# Patient Record
Sex: Female | Born: 1935 | Race: White | Hispanic: No | Smoking: Never smoker
Health system: Southern US, Community
[De-identification: ages and names within clinical notes are randomized; demographics above are authoritative.]

## PROBLEM LIST (undated history)

## (undated) DIAGNOSIS — Z7901 Long term (current) use of anticoagulants: Secondary | ICD-10-CM

## (undated) DIAGNOSIS — D649 Anemia, unspecified: Secondary | ICD-10-CM

## (undated) DIAGNOSIS — I739 Peripheral vascular disease, unspecified: Secondary | ICD-10-CM

## (undated) DIAGNOSIS — I495 Sick sinus syndrome: Secondary | ICD-10-CM

## (undated) DIAGNOSIS — I351 Nonrheumatic aortic (valve) insufficiency: Secondary | ICD-10-CM

## (undated) DIAGNOSIS — I779 Disorder of arteries and arterioles, unspecified: Secondary | ICD-10-CM

## (undated) DIAGNOSIS — R942 Abnormal results of pulmonary function studies: Secondary | ICD-10-CM

## (undated) DIAGNOSIS — R9430 Abnormal result of cardiovascular function study, unspecified: Secondary | ICD-10-CM

## (undated) DIAGNOSIS — J90 Pleural effusion, not elsewhere classified: Secondary | ICD-10-CM

## (undated) DIAGNOSIS — E039 Hypothyroidism, unspecified: Secondary | ICD-10-CM

## (undated) DIAGNOSIS — N184 Chronic kidney disease, stage 4 (severe): Secondary | ICD-10-CM

## (undated) DIAGNOSIS — I10 Essential (primary) hypertension: Secondary | ICD-10-CM

## (undated) DIAGNOSIS — I509 Heart failure, unspecified: Secondary | ICD-10-CM

## (undated) DIAGNOSIS — Z9229 Personal history of other drug therapy: Secondary | ICD-10-CM

## (undated) DIAGNOSIS — I2510 Atherosclerotic heart disease of native coronary artery without angina pectoris: Secondary | ICD-10-CM

## (undated) DIAGNOSIS — Z950 Presence of cardiac pacemaker: Secondary | ICD-10-CM

## (undated) DIAGNOSIS — I480 Paroxysmal atrial fibrillation: Secondary | ICD-10-CM

## (undated) HISTORY — DX: Sick sinus syndrome: I49.5

## (undated) HISTORY — DX: Personal history of other drug therapy: Z92.29

## (undated) HISTORY — DX: Atherosclerotic heart disease of native coronary artery without angina pectoris: I25.10

## (undated) HISTORY — DX: Chronic kidney disease, stage 4 (severe): N18.4

## (undated) HISTORY — DX: Peripheral vascular disease, unspecified: I73.9

## (undated) HISTORY — DX: Disorder of arteries and arterioles, unspecified: I77.9

## (undated) HISTORY — DX: Heart failure, unspecified: I50.9

## (undated) HISTORY — DX: Abnormal results of pulmonary function studies: R94.2

## (undated) HISTORY — DX: Anemia, unspecified: D64.9

## (undated) HISTORY — DX: Paroxysmal atrial fibrillation: I48.0

## (undated) HISTORY — DX: Hypothyroidism, unspecified: E03.9

## (undated) HISTORY — DX: Presence of cardiac pacemaker: Z95.0

## (undated) HISTORY — PX: ABDOMINAL HYSTERECTOMY: SHX81

## (undated) HISTORY — DX: Pleural effusion, not elsewhere classified: J90

## (undated) HISTORY — DX: Long term (current) use of anticoagulants: Z79.01

## (undated) HISTORY — DX: Nonrheumatic aortic (valve) insufficiency: I35.1

## (undated) HISTORY — DX: Essential (primary) hypertension: I10

## (undated) HISTORY — DX: Abnormal result of cardiovascular function study, unspecified: R94.30

---

## 2008-02-09 ENCOUNTER — Ambulatory Visit: Payer: Self-pay | Admitting: Cardiology

## 2008-02-11 ENCOUNTER — Encounter: Payer: Self-pay | Admitting: Cardiology

## 2008-02-17 ENCOUNTER — Encounter: Payer: Self-pay | Admitting: Cardiology

## 2010-10-15 ENCOUNTER — Inpatient Hospital Stay (HOSPITAL_COMMUNITY)
Admission: EM | Admit: 2010-10-15 | Discharge: 2010-11-05 | Payer: Self-pay | Attending: Cardiology | Admitting: Cardiology

## 2010-10-16 ENCOUNTER — Encounter: Payer: Self-pay | Admitting: Cardiology

## 2010-10-17 ENCOUNTER — Encounter: Payer: Self-pay | Admitting: Cardiology

## 2010-10-23 LAB — BASIC METABOLIC PANEL
BUN: 56 mg/dL — ABNORMAL HIGH (ref 6–23)
CO2: 29 mEq/L (ref 19–32)
Calcium: 8.4 mg/dL (ref 8.4–10.5)
Chloride: 100 mEq/L (ref 96–112)
Creatinine, Ser: 3.47 mg/dL — ABNORMAL HIGH (ref 0.4–1.2)
GFR calc Af Amer: 15 mL/min — ABNORMAL LOW (ref >60)
GFR calc non Af Amer: 13 mL/min — ABNORMAL LOW (ref >60)
Glucose, Bld: 107 mg/dL — ABNORMAL HIGH (ref 70–99)
Potassium: 4.5 mEq/L (ref 3.5–5.1)
Sodium: 137 mEq/L (ref 135–145)

## 2010-10-23 LAB — CBC
HCT: 26.5 % — ABNORMAL LOW (ref 36.0–46.0)
Hemoglobin: 8.4 g/dL — ABNORMAL LOW (ref 12.0–15.0)
MCH: 29.9 pg (ref 26.0–34.0)
MCHC: 31.7 g/dL (ref 30.0–36.0)
MCV: 94.3 fL (ref 78.0–100.0)
Platelets: 278 10*3/uL (ref 150–400)
RBC: 2.81 MIL/uL — ABNORMAL LOW (ref 3.87–5.11)
RDW: 15.3 % (ref 11.5–15.5)
WBC: 7.2 10*3/uL (ref 4.0–10.5)

## 2010-10-23 LAB — PROTIME-INR
INR: 4.61 — ABNORMAL HIGH (ref 0.00–1.49)
Prothrombin Time: 43.4 seconds — ABNORMAL HIGH (ref 11.6–15.2)

## 2010-10-23 LAB — HEPARIN LEVEL (UNFRACTIONATED): Heparin Unfractionated: 0.43 IU/mL (ref 0.30–0.70)

## 2010-10-24 LAB — BASIC METABOLIC PANEL
BUN: 45 mg/dL — ABNORMAL HIGH (ref 6–23)
CO2: 28 mEq/L (ref 19–32)
Calcium: 8.4 mg/dL (ref 8.4–10.5)
Chloride: 100 mEq/L (ref 96–112)
Creatinine, Ser: 3.03 mg/dL — ABNORMAL HIGH (ref 0.4–1.2)
GFR calc Af Amer: 18 mL/min — ABNORMAL LOW (ref >60)
GFR calc non Af Amer: 15 mL/min — ABNORMAL LOW (ref >60)
Glucose, Bld: 94 mg/dL (ref 70–99)
Potassium: 4.5 mEq/L (ref 3.5–5.1)
Sodium: 137 mEq/L (ref 135–145)

## 2010-10-24 LAB — CBC
HCT: 28.3 % — ABNORMAL LOW (ref 36.0–46.0)
Hemoglobin: 9 g/dL — ABNORMAL LOW (ref 12.0–15.0)
MCH: 29.7 pg (ref 26.0–34.0)
MCHC: 31.8 g/dL (ref 30.0–36.0)
MCV: 93.4 fL (ref 78.0–100.0)
Platelets: 330 10*3/uL (ref 150–400)
RBC: 3.03 MIL/uL — ABNORMAL LOW (ref 3.87–5.11)
RDW: 15.2 % (ref 11.5–15.5)
WBC: 8.5 10*3/uL (ref 4.0–10.5)

## 2010-10-24 LAB — PROTIME-INR
INR: 4.14 — ABNORMAL HIGH (ref 0.00–1.49)
Prothrombin Time: 40 seconds — ABNORMAL HIGH (ref 11.6–15.2)

## 2010-10-24 LAB — HEPARIN LEVEL (UNFRACTIONATED): Heparin Unfractionated: <0.1 IU/mL — ABNORMAL LOW (ref 0.30–0.70)

## 2010-10-24 LAB — HEMOCCULT GUIAC POC 1CARD (OFFICE): Fecal Occult Bld: NEGATIVE

## 2010-10-25 LAB — CBC
HCT: 29.4 % — ABNORMAL LOW (ref 36.0–46.0)
Hemoglobin: 9.3 g/dL — ABNORMAL LOW (ref 12.0–15.0)
MCH: 29.6 pg (ref 26.0–34.0)
MCHC: 31.6 g/dL (ref 30.0–36.0)
MCV: 93.6 fL (ref 78.0–100.0)
Platelets: 371 10*3/uL (ref 150–400)
RBC: 3.14 MIL/uL — ABNORMAL LOW (ref 3.87–5.11)
RDW: 15.4 % (ref 11.5–15.5)
WBC: 8.6 10*3/uL (ref 4.0–10.5)

## 2010-10-25 LAB — PROTIME-INR
INR: 2.85 — ABNORMAL HIGH (ref 0.00–1.49)
Prothrombin Time: 30 seconds — ABNORMAL HIGH (ref 11.6–15.2)

## 2010-10-25 LAB — BASIC METABOLIC PANEL
BUN: 40 mg/dL — ABNORMAL HIGH (ref 6–23)
CO2: 26 mEq/L (ref 19–32)
Calcium: 8.3 mg/dL — ABNORMAL LOW (ref 8.4–10.5)
Chloride: 99 mEq/L (ref 96–112)
Creatinine, Ser: 2.75 mg/dL — ABNORMAL HIGH (ref 0.4–1.2)
GFR calc Af Amer: 20 mL/min — ABNORMAL LOW (ref >60)
GFR calc non Af Amer: 16 mL/min — ABNORMAL LOW (ref >60)
Glucose, Bld: 93 mg/dL (ref 70–99)
Potassium: 4.5 mEq/L (ref 3.5–5.1)
Sodium: 134 mEq/L — ABNORMAL LOW (ref 135–145)

## 2010-10-31 ENCOUNTER — Encounter: Payer: Self-pay | Admitting: Cardiology

## 2010-11-01 HISTORY — PX: PACEMAKER INSERTION: SHX728

## 2010-11-03 ENCOUNTER — Encounter: Payer: Self-pay | Admitting: Cardiology

## 2010-11-04 ENCOUNTER — Encounter: Payer: Self-pay | Admitting: Internal Medicine

## 2010-11-04 LAB — CBC
HCT: 29.5 % — ABNORMAL LOW (ref 36.0–46.0)
HCT: 27.6 % — ABNORMAL LOW (ref 36.0–46.0)
HCT: 27.3 % — ABNORMAL LOW (ref 36.0–46.0)
HCT: 30.6 % — ABNORMAL LOW (ref 36.0–46.0)
Hemoglobin: 9.4 g/dL — ABNORMAL LOW (ref 12.0–15.0)
Hemoglobin: 8.8 g/dL — ABNORMAL LOW (ref 12.0–15.0)
Hemoglobin: 8.7 g/dL — ABNORMAL LOW (ref 12.0–15.0)
Hemoglobin: 9.7 g/dL — ABNORMAL LOW (ref 12.0–15.0)
MCH: 29.4 pg (ref 26.0–34.0)
MCH: 29.7 pg (ref 26.0–34.0)
MCH: 29.7 pg (ref 26.0–34.0)
MCH: 29.3 pg (ref 26.0–34.0)
MCHC: 31.9 g/dL (ref 30.0–36.0)
MCHC: 31.9 g/dL (ref 30.0–36.0)
MCHC: 31.9 g/dL (ref 30.0–36.0)
MCHC: 31.7 g/dL (ref 30.0–36.0)
MCV: 92.2 fL (ref 78.0–100.0)
MCV: 93.2 fL (ref 78.0–100.0)
MCV: 93.2 fL (ref 78.0–100.0)
MCV: 92.4 fL (ref 78.0–100.0)
Platelets: 386 10*3/uL (ref 150–400)
Platelets: 374 10*3/uL (ref 150–400)
Platelets: 374 10*3/uL (ref 150–400)
Platelets: 467 10*3/uL — ABNORMAL HIGH (ref 150–400)
RBC: 3.2 MIL/uL — ABNORMAL LOW (ref 3.87–5.11)
RBC: 2.96 MIL/uL — ABNORMAL LOW (ref 3.87–5.11)
RBC: 2.93 MIL/uL — ABNORMAL LOW (ref 3.87–5.11)
RBC: 3.31 MIL/uL — ABNORMAL LOW (ref 3.87–5.11)
RDW: 15.4 % (ref 11.5–15.5)
RDW: 15.6 % — ABNORMAL HIGH (ref 11.5–15.5)
RDW: 15.8 % — ABNORMAL HIGH (ref 11.5–15.5)
RDW: 15.5 % (ref 11.5–15.5)
WBC: 9 10*3/uL (ref 4.0–10.5)
WBC: 8.8 10*3/uL (ref 4.0–10.5)
WBC: 10.6 10*3/uL — ABNORMAL HIGH (ref 4.0–10.5)
WBC: 11.5 10*3/uL — ABNORMAL HIGH (ref 4.0–10.5)

## 2010-11-04 LAB — BASIC METABOLIC PANEL
BUN: 40 mg/dL — ABNORMAL HIGH (ref 6–23)
BUN: 43 mg/dL — ABNORMAL HIGH (ref 6–23)
BUN: 48 mg/dL — ABNORMAL HIGH (ref 6–23)
BUN: 47 mg/dL — ABNORMAL HIGH (ref 6–23)
BUN: 46 mg/dL — ABNORMAL HIGH (ref 6–23)
CO2: 26 mEq/L (ref 19–32)
CO2: 27 mEq/L (ref 19–32)
CO2: 28 mEq/L (ref 19–32)
CO2: 30 mEq/L (ref 19–32)
CO2: 31 mEq/L (ref 19–32)
Calcium: 8.2 mg/dL — ABNORMAL LOW (ref 8.4–10.5)
Calcium: 8 mg/dL — ABNORMAL LOW (ref 8.4–10.5)
Calcium: 8.1 mg/dL — ABNORMAL LOW (ref 8.4–10.5)
Calcium: 8.3 mg/dL — ABNORMAL LOW (ref 8.4–10.5)
Calcium: 8.5 mg/dL (ref 8.4–10.5)
Chloride: 100 mEq/L (ref 96–112)
Chloride: 103 mEq/L (ref 96–112)
Chloride: 100 mEq/L (ref 96–112)
Chloride: 96 mEq/L (ref 96–112)
Chloride: 98 mEq/L (ref 96–112)
Creatinine, Ser: 2.61 mg/dL — ABNORMAL HIGH (ref 0.4–1.2)
Creatinine, Ser: 2.59 mg/dL — ABNORMAL HIGH (ref 0.4–1.2)
Creatinine, Ser: 2.66 mg/dL — ABNORMAL HIGH (ref 0.4–1.2)
Creatinine, Ser: 2.47 mg/dL — ABNORMAL HIGH (ref 0.4–1.2)
Creatinine, Ser: 2.21 mg/dL — ABNORMAL HIGH (ref 0.4–1.2)
GFR calc Af Amer: 21 mL/min — ABNORMAL LOW (ref >60)
GFR calc Af Amer: 21 mL/min — ABNORMAL LOW (ref >60)
GFR calc Af Amer: 21 mL/min — ABNORMAL LOW (ref >60)
GFR calc Af Amer: 23 mL/min — ABNORMAL LOW (ref >60)
GFR calc Af Amer: 26 mL/min — ABNORMAL LOW (ref >60)
GFR calc non Af Amer: 18 mL/min — ABNORMAL LOW (ref >60)
GFR calc non Af Amer: 18 mL/min — ABNORMAL LOW (ref >60)
GFR calc non Af Amer: 17 mL/min — ABNORMAL LOW (ref >60)
GFR calc non Af Amer: 19 mL/min — ABNORMAL LOW (ref >60)
GFR calc non Af Amer: 21 mL/min — ABNORMAL LOW (ref >60)
Glucose, Bld: 104 mg/dL — ABNORMAL HIGH (ref 70–99)
Glucose, Bld: 109 mg/dL — ABNORMAL HIGH (ref 70–99)
Glucose, Bld: 113 mg/dL — ABNORMAL HIGH (ref 70–99)
Glucose, Bld: 106 mg/dL — ABNORMAL HIGH (ref 70–99)
Glucose, Bld: 103 mg/dL — ABNORMAL HIGH (ref 70–99)
Potassium: 5 mEq/L (ref 3.5–5.1)
Potassium: 5 mEq/L (ref 3.5–5.1)
Potassium: 4.7 mEq/L (ref 3.5–5.1)
Potassium: 4.4 mEq/L (ref 3.5–5.1)
Potassium: 4.3 mEq/L (ref 3.5–5.1)
Sodium: 135 mEq/L (ref 135–145)
Sodium: 136 mEq/L (ref 135–145)
Sodium: 136 mEq/L (ref 135–145)
Sodium: 136 mEq/L (ref 135–145)
Sodium: 137 mEq/L (ref 135–145)

## 2010-11-04 LAB — PROTIME-INR
INR: 2.37 — ABNORMAL HIGH (ref 0.00–1.49)
INR: 2.47 — ABNORMAL HIGH (ref 0.00–1.49)
INR: 2.37 — ABNORMAL HIGH (ref 0.00–1.49)
INR: 2.05 — ABNORMAL HIGH (ref 0.00–1.49)
INR: 1.79 — ABNORMAL HIGH (ref 0.00–1.49)
INR: 1.72 — ABNORMAL HIGH (ref 0.00–1.49)
INR: 1.6 — ABNORMAL HIGH (ref 0.00–1.49)
INR: 1.84 — ABNORMAL HIGH (ref 0.00–1.49)
INR: 2.5 — ABNORMAL HIGH (ref 0.00–1.49)
Prothrombin Time: 26 seconds — ABNORMAL HIGH (ref 11.6–15.2)
Prothrombin Time: 26.9 seconds — ABNORMAL HIGH (ref 11.6–15.2)
Prothrombin Time: 26 seconds — ABNORMAL HIGH (ref 11.6–15.2)
Prothrombin Time: 23.3 seconds — ABNORMAL HIGH (ref 11.6–15.2)
Prothrombin Time: 21 seconds — ABNORMAL HIGH (ref 11.6–15.2)
Prothrombin Time: 20.3 seconds — ABNORMAL HIGH (ref 11.6–15.2)
Prothrombin Time: 19.2 seconds — ABNORMAL HIGH (ref 11.6–15.2)
Prothrombin Time: 21.4 seconds — ABNORMAL HIGH (ref 11.6–15.2)
Prothrombin Time: 27.1 seconds — ABNORMAL HIGH (ref 11.6–15.2)

## 2010-11-04 LAB — BRAIN NATRIURETIC PEPTIDE: Pro B Natriuretic peptide (BNP): 1591 pg/mL — ABNORMAL HIGH (ref 0.0–100.0)

## 2010-11-06 LAB — PROTIME-INR
INR: 2.72 — ABNORMAL HIGH (ref 0.00–1.49)
INR: 2.2 — ABNORMAL HIGH (ref 0.00–1.49)
Prothrombin Time: 28.9 seconds — ABNORMAL HIGH (ref 11.6–15.2)
Prothrombin Time: 24.6 seconds — ABNORMAL HIGH (ref 11.6–15.2)

## 2010-11-08 NOTE — Discharge Summary (Signed)
NAMEBARBARITA, [name] NO.:  [id number]  MEDICAL RECORD NO.:  [id number]          PATIENT TYPE:  INP  LOCATION:  3701                         FACILITY:  [facility]  PHYSICIAN:  Jesse Sans. Wall, MD, FACCDATE OF BIRTH:  1926-04-30  DATE OF ADMISSION:  10/15/2010 DATE OF DISCHARGE:  11/04/2010                              DISCHARGE SUMMARY   PRIMARY CARDIOLOGIST:  [facility] (will be set up with followup in the next 1-3 weeks, has not been seen in office previously)  ELECTROPHYSIOLOGIST:  Doylene Canning. Ladona Ridgel, MD  DISCHARGE DIAGNOSES: 1. Non-ST segment elevation myocardial infarction (presumed old).     a.     Peak troponin 0.10 on second set, for reported symptoms      likely 3 to 4 weeks prior to presentation.  Risks, benefits are      non-favor of diagnostic cardiac catheterization this admission. 2. Acute systolic congestive heart failure secondary to ischemic     cardiomyopathy, well compensated with LVEF 30% to 35%.     a.     2-D echocardiogram on October 16, 2010:  LV cavity size      normal, mild concentric hypertrophy, LVEF of 30% to 35% with      akinesis of the apical myocardium and aneurysmal deformity of the      apical myocardium.  Mild aortic insufficiency.  Calcified mitral      valve annulus with mild MR.  PA peak pressure at 39 mmHg. 3. Paroxysmal atrial fibrillation.     a.     Anticoagulated on Coumadin secondary to a CHADS-VAS score      equal to 6 and antiarrhythmic therapy on amiodarone 200 mg p.o.      daily. 4. Acute on chronic renal failure (CKD, stage IV). 5. Left pleural effusion.     a.     Status post thoracocentesis with 950 mL blood-tinged fluid      (pathology report shows no malignant cells, but reactive      mesothelial cells present).     b.     Chest tube placed on October 31, 2010.  Followup arranged at      [facility] as well as with Dr. Edwyna Shell on November 12, 2010 at 1:00 p.m.      including chest x-ray that day. 6. Tachy-brady  syndrome.     a.     Implantation of Medtronic dual-chamber pacemaker on November 01, 2010 without apparent complications. 7. Deconditioning.     a.     PT eval deemed appropriate for discharge to SNF.  Worked      with Cardiac Rehab phase I for the duration of hospital stay. 8. Anemia.     a.     Hemoglobin nadir of 8.4 and fecal occult blood negative.      Coumadin briefly held, but after H and H stabilized Coumadin      resumed secondary to increased thromboembolic risk. 9. Poor p.o. intake.     a.     Nutrition consult with recommendation for ensure      supplementation b.i.d. as well  as education/encouragement in      regards to proper nutrition/p.o. intake.  SECONDARY DIAGNOSES: 1. Hypertension. 2. Status post hysterectomy.  ALLERGIES AND INTOLERANCES:  PENICILLIN (unknown reaction.).  PROCEDURES: 1. EKG on October 15, 2010:  Junctional bradycardia, 46 bpm, low-     voltage QRS, question significant Q-waves in anterolateral leads, T-     wave inversion in V3 through V4 as well as one and aVL, nonspecific     ST - T-wave changes, otherwise QRS 68, QTC 367. 2. EKG on October 15, 2010:  Atrial fibrillation with RVR, 103 bpm,     otherwise no significant changes, QTC of 531. 3. CXR on October 16, 2010:  Moderately large left pleural effusion     with associated basilar airspace disease, question atelectasis     versus pneumonia.  Very small right pleural effusion.  Cardiomegaly     without edema. 4. EKG on October 16, 2010:  No significant change from prior     tracing. 5. Chest x-ray/left decubitus on October 16, 2010:  Layering and     moderate-to-large left pleural effusion. 6. 2-D echocardiogram that was done October 16, 2010.  Please see     discharge diagnoses section under acute systolic heart failure,     subsection A. 7. EKG on October 17, 2010:  NSR, 96 bpm, PVC, otherwise no     significant changes from prior tracing. 8. Ultrasound-guided left  thoracocentesis on October 17, 2010:     Successful ultrasound-guided left thoracocentesis yielding 950 mL     of blood-tinged pleural fluid. 9. EKG on October 18, 2010:  NSR, no significant change from prior     tracings. 10.Chest x-ray on October 21, 2010:  Slight interval increase in     moderate left pleural effusion without edema or other new findings. 11.EKG on October 22, 2010:  Marked sinus bradycardia, 48 bpm,     otherwise no significant changes prior tracing. 12.Chest x-ray on October 28, 2010:  Significant interval increase in     left pleural effusion. 13.Chest x-ray on October 31, 2010:  No significant change.  Stable     large left effusion and small right effusion. 14.Chest x-ray on October 31, 2010:  Decreased left pleural effusion     with a catheter in place.  No pneumothorax. 15.Insertion of the left PleurX catheter on October 31, 2010:     Successful without significant. 16.Chest x-ray on November 01, 2010:  Some decrease in small left     pleural effusion with a PleurX catheter in place.  No pneumothorax     or new abnormality. 17.Implantation of Medtronic dual-chamber pacemaker on  November 01, 2010:  Successful without apparent complications. 18.Chest x-ray on November 02, 2010:  Pacemaker placement without     evidence of pneumothorax. 19.EKG on November 02, 2010:  NSR, 87 bpm, no significant change from     other tracings, otherwise. 20.EKG on November 03, 2010:  NSR, 85 bpm, otherwise no significant     changes from prior tracing.  HISTORY OF PRESENT ILLNESS:  [name] is an 77 year old Caucasian female with the above-noted medical history, which did not include any known history of coronary artery disease who presented initially the [facility] with complaints of 3 weeks of worsening shortness of breath/dyspnea on exertion and no frank chest pain but "chest fullness" as well as unintentional weight loss of about 10 pounds.  She was subsequently found  to have a large  left pleural effusion and a troponin of 0.11 as well as slight soft blood pressures in the high 90s and was transferred to [facility] for further eval/therapy.  HOSPITAL COURSE:  The patient was admitted to [facility] and it was determined that the patient likely had NSTEMI that had occurred approximately 3 weeks prior and due to her multiple comorbidities, risk and benefit analysis did not favor cardiac catheterization at that time. She did undergo thoracocentesis from Interventional Radiology as described in the discharge diagnosis and procedure section.  No significant complications, but unfortunately there was return of significant pleural effusion on left side and she eventually required a chest tube placed as described in procedure section.  She is being discharged with this tube in and will be drained on Monday, Wednesday, Friday at her skilled nursing facility and she has followup with Dr. Edwyna Shell is scheduled on the 24.  The patient was initially noted to be bradycardic on initial EKG tracings and was evaluated by Electrophysiology, but there was hope that she could avoid pacemaker placement in the setting of multiple comorbidities.  Unfortunately, she was noted to have a 5.15-second pause on the very early morning of November 01, 2010 and as her meds have been adjusted several times, it was felt that she would require pacemaker implantation, which was completed that day without significant complications.  Her hospital course was further complicated by the finding of anemia, but with no evidence of frank blood and once her H and H stabled, her Coumadin which had briefly been held was resumed.  No further workup was planned as her H and H was rising.  Her initial presentation was consistent with acute systolic heart failure and initially she was able to be diuresed even in the setting of acute on chronic renal failure, CKD stage IV, but eventually secondary to  recurrence of volume overload in the setting of even higher creatinine levels, the patient was briefly (2 days) treated with positive inotrope/dobutamine.  The patient responded well and no further positive inotropes were required this admission.  It was also noted on her admission that she had poor p.o. intake and actually lost 10 pounds even in the setting of her acute systolic heart failure and nutrition consult was called recommending Ensure b.i.d. as well as encouraging education on the importance of heart-healthy diet and regular meals/proper p.o. intake.  Secondary to all of the above, the patient was not especially active and was deconditioned when she arrived and this continued be a problem during her hospital stay.  She did work daily with Cardiac Rehab phase 1 as well as being evaluated by Physical Therapy who determined that she was appropriate for discharge to a skilled nursing facility.  A bed appears to be available today, November 04, 2010, although we are awaiting confirmation.  She was deemed stable for discharge by attending cardiologist, Dr. Valera Castle in the morning of January 16.  She has the above followup with Dr. Edwyna Shell and will have INR/PT checked in her skilled nursing facility.  She has an appointment to be seen at the [facility] on January 23 at 9:30 a.m.  She will also be set up to see one of the cardiologist or a physician assistant at [facility] in the next 2-3 weeks for management of her presumed coronary artery disease as well as systolic heart failure (2-D echocardiogram did show depressed LVEF of 30% - 35% on the December 28).  At the time of  discharge, the patient received her new medication list, prescriptions, followup instructions, post pacemaker implantation instructions, and all questions and concerns were addressed prior to leaving the hospital.  DISCHARGE LABS:  WBC is 11.5, HGB 9.7, HCT 30.6, PLT count is 467.   Pro time 28.9, INR 2.72.  Sodium 137, potassium 4.3, chloride 98, bicarb 31, BUN 46, creatinine 2.21, glucose 103, calcium 8.5.  Liver function tests on December 28, all within normal limits.  Total protein was 5.9 and albumin 2.6.  Hemoglobin A1c 6.2%.  First set of cardiac enzymes CK 39, MB 2.0, troponin 0.11.  Second set, CK 45, MB 3.0, troponin-I 0.10. Third set CK 53, MB 3.3, troponin-I 0.09.  Total cholesterol 121, triglycerides 57, HDL 43, LDL 67, total cholesterol/HDL ratio of 2.8. TSH 0.780.  Pleural fluid, total protein 3.3, LDH 152.  Color was orange, appearance hazy, wbc count 69, segmented neutrophils 25, lymphocytes 52, monocyte - macrophage 19, eosinophils 4, mesothelial cells present.  Fecal occult blood negative.  Note, pleural fluid no organisms seen and no growth x3 days.  FOLLOWUP PLANS AND APPOINTMENTS: 1. [facility], November 11, 2010 at 9:30 a.m. 2. Dr. Edwyna Shell, November 12, 2010 at 1:00 p.m. 3. [facility] in approximately 2 weeks (the patient will be     contacted with date and time).  DURATION OF DISCHARGE ENCOUNTER INCLUDING PHYSICIAN TIME:  1 hour.     Jarrett Ables, PAC   ______________________________ Jesse Sans. Daleen Squibb, MD, [facility]    MS/MEDQ  D:  11/04/2010  T:  11/04/2010  Job:  [id number]  cc:   Ines Bloomer, M.D. Doylene Canning. Ladona Ridgel, MD Marca Ancona, MD Tereso Newcomer, PA-C  Electronically Signed by Jarrett Ables PAC on 11/08/2010 01:34:10 PM Electronically Signed by Valera Castle MD [facility] on 11/08/2010 04:16:14 PM

## 2010-11-11 ENCOUNTER — Ambulatory Visit: Admit: 2010-11-11 | Payer: Self-pay

## 2010-11-12 ENCOUNTER — Encounter: Payer: Self-pay | Admitting: Cardiology

## 2010-11-12 ENCOUNTER — Ambulatory Visit
Admission: RE | Admit: 2010-11-12 | Discharge: 2010-11-12 | Payer: Self-pay | Source: Home / Self Care | Attending: Thoracic Surgery | Admitting: Thoracic Surgery

## 2010-11-12 ENCOUNTER — Encounter
Admission: RE | Admit: 2010-11-12 | Discharge: 2010-11-12 | Payer: Self-pay | Source: Home / Self Care | Attending: Thoracic Surgery | Admitting: Thoracic Surgery

## 2010-11-13 NOTE — Assessment & Plan Note (Unsigned)
OFFICE VISIT  [name], [name] A DOB:  10-02-1926                                        November 12, 2010 CHART #:  [id number]  HISTORY:  The patient is an 77 year old white female with a history of congestive heart failure who on October 31, 2010, had placement of a left-sided Pleurx catheter due to large effusion.  At the time of placement 1200 mL of fluid were obtained.  She is now back at the nursing facility and they have drained it on three occasions with low relative volumes of 250 and 300 mL.  On today's date, she is seen to check the catheter as well as a chest x-ray chest.  Chest x-ray was obtained and it reveals no significant findings of effusion.  The catheter appears to be in good position.  PHYSICAL EXAMINATION:  afebrile.  Vital signs stable.  Alert, elderly female in no acute distress.  Pulmonary exam reveals clear lungs throughout.  Cardiac examination, regular rate and rhythm.  No gallop. The catheter site is inspected and shows no evidence of infection although the dressing is not specifically as we require.  ASSESSMENT:  [name] is stable with the Pleurx catheter functioning well.  Her drainage amounts are small.  We will convert it to twice weekly.  We will send the nursing facility instructions on how to do the dressing changes for the catheter.  We will see her again in 2 weeks with a chest x-ray and pending the amounts of drainage it is possible we will schedule for catheter removal.  Rowe Clack, P.A.-C.  Sherryll Burger  D:  11/12/2010  T:  11/13/2010  Job:  [id number]

## 2010-11-14 ENCOUNTER — Telehealth: Payer: Self-pay | Admitting: Cardiology

## 2010-11-18 ENCOUNTER — Ambulatory Visit
Admission: RE | Admit: 2010-11-18 | Discharge: 2010-11-18 | Payer: Self-pay | Source: Home / Self Care | Attending: Cardiology | Admitting: Cardiology

## 2010-11-18 ENCOUNTER — Other Ambulatory Visit: Payer: Self-pay | Admitting: Cardiology

## 2010-11-18 ENCOUNTER — Encounter: Payer: Self-pay | Admitting: Internal Medicine

## 2010-11-18 ENCOUNTER — Encounter: Payer: Self-pay | Admitting: Cardiology

## 2010-11-18 ENCOUNTER — Telehealth: Payer: Self-pay | Admitting: Cardiology

## 2010-11-18 LAB — HEPATIC FUNCTION PANEL
ALT: 51 U/L — ABNORMAL HIGH (ref 0–35)
AST: 40 U/L — ABNORMAL HIGH (ref 0–37)
Albumin: 2.5 g/dL — ABNORMAL LOW (ref 3.5–5.2)
Alkaline Phosphatase: 121 U/L — ABNORMAL HIGH (ref 39–117)
Bilirubin, Direct: 0.1 mg/dL (ref 0.0–0.3)
Total Bilirubin: 0.6 mg/dL (ref 0.3–1.2)
Total Protein: 6 g/dL (ref 6.0–8.3)

## 2010-11-18 LAB — TSH: TSH: 3.91 u[IU]/mL (ref 0.35–5.50)

## 2010-11-20 ENCOUNTER — Encounter: Payer: Self-pay | Admitting: Internal Medicine

## 2010-11-21 NOTE — Miscellaneous (Signed)
Summary: Device preload  Clinical Lists Changes  Observations: Added new observation of PPM INDICATN: Sick sinus syndrome (11/04/2010 15:13) Added new observation of MAGNET RTE: BOL 85 ERI 65 (11/04/2010 15:13) Added new observation of PPMLEADSTAT2: active (11/04/2010 15:13) Added new observation of PPMLEADSER2: [id number] (11/04/2010 15:13) Added new observation of PPMLEADMOD2: 5076  (11/04/2010 15:13) Added new observation of PPMLEADLOC2: RV  (11/04/2010 15:13) Added new observation of PPMLEADSTAT1: active  (11/04/2010 15:13) Added new observation of PPMLEADSER1: [id number]  (11/04/2010 15:13) Added new observation of PPMLEADMOD1: 5076  (11/04/2010 15:13) Added new observation of PPMLEADLOC1: RA  (11/04/2010 15:13) Added new observation of PPM IMP MD: Lewayne Bunting, MD  (11/04/2010 15:13) Added new observation of PPMLEADDOI2: 11/01/2010  (11/04/2010 15:13) Added new observation of PPMLEADDOI1: 11/01/2010  (11/04/2010 15:13) Added new observation of PPM DOI: 11/01/2010  (11/04/2010 15:13) Added new observation of PPM SERL#: [id number]  (11/04/2010 15:13) Added new observation of PPM MODL#: [id number]  (11/04/2010 32:44) Added new observation of PACEMAKERMFG: Medtronic  (11/04/2010 15:13) Added new observation of PACEMAKER MD: Lewayne Bunting, MD  (11/04/2010 15:13)      PPM Specifications Following MD:  Lewayne Bunting, MD     PPM Vendor:  Medtronic     PPM Model Number:  [id number]     PPM Serial Number:  [id number] PPM DOI:  11/01/2010     PPM Implanting MD:  Lewayne Bunting, MD  Lead 1    Location: RA     DOI: 11/01/2010     Model #: [id number]     Serial #: [id number]     Status: active Lead 2    Location: RV     DOI: 11/01/2010     Model #: [id number]     Serial #: [id number]     Status: active  Magnet Response Rate:  BOL 85 ERI 65  Indications:  Sick sinus syndrome

## 2010-11-21 NOTE — Progress Notes (Signed)
Summary: pt daughter re mother condition  Phone Note Call from Patient Call back at [phone]   Caller: Daughter/pam Reason for Call: Talk to Nurse Summary of Call: pt daughter calling re pt having sob when she tries to get up and walk. pt daughter wants to know why she was not label as fall risk in her chart when she was discharge from the hospital. Initial call taken by: Roe Coombs,  November 14, 2010 11:40 AM     Appended Document: pt daughter re mother condition Unsure why not labeled fall risk, I did not discharge her.  Needs followup soon.   Appended Document: pt daughter re mother condition I talked with daughter, Caren Hazy  [phone] is in [place])--she has concerns about her mother's SOB --she states when she talks with her mother on the telephone she seems very SOB--she is also concerned because her mother has been taken off fall precautions by physical therapy at the [facility] in [place]--[phone]--Pam states she has talked with the nurse at the [facility] and expressed her concerns to the nurse--daughter  states pt  has a medical doctor there,but the doctor has not seen the patient since she has been there--I recommended that the daughter call the [facility] and again express her concerns --daughter agreed with this plan

## 2010-11-22 ENCOUNTER — Telehealth: Payer: Self-pay | Admitting: Cardiology

## 2010-11-25 ENCOUNTER — Encounter: Payer: Self-pay | Admitting: Cardiology

## 2010-11-25 ENCOUNTER — Encounter: Payer: Self-pay | Admitting: Internal Medicine

## 2010-11-25 ENCOUNTER — Encounter: Payer: Self-pay | Admitting: Physician Assistant

## 2010-11-25 ENCOUNTER — Other Ambulatory Visit: Payer: Self-pay | Admitting: Thoracic Surgery

## 2010-11-25 DIAGNOSIS — R0602 Shortness of breath: Secondary | ICD-10-CM

## 2010-11-25 DIAGNOSIS — J90 Pleural effusion, not elsewhere classified: Secondary | ICD-10-CM

## 2010-11-26 ENCOUNTER — Ambulatory Visit: Payer: Medicare Other | Admitting: Thoracic Surgery

## 2010-11-26 DIAGNOSIS — I5023 Acute on chronic systolic (congestive) heart failure: Secondary | ICD-10-CM

## 2010-11-27 NOTE — Progress Notes (Signed)
Summary: need office notes mailed to daughter [facility]  Phone Note Call from Patient Call back at Home Phone [phone]   Caller: Daughter/ Pam  Summary of Call: Pt daughter want notes from todays office visit mail to her Caren Hazy [address] [place] [address] and want notes faxed to the [facility] that the pt is to have a walker to help her get around. Initial call taken by: Judie Grieve,  November 18, 2010 4:31 PM  Follow-up for Phone Call        See previous note regarding the walker. Ms. Sellin daughter was not at the listed number so I left a message with [facility] that we would mail the information to her once Dr . Shirlee Latch has signed his note. Whitney Maeola Sarah RN  November 18, 2010 4:46 PM  Follow-up by: Whitney Maeola Sarah RN,  November 18, 2010 4:46 PM

## 2010-11-27 NOTE — Assessment & Plan Note (Signed)
Summary: 2 weeks eph/mt   Visit Type:  post hospital  CC:  shortness of breath - edema in ankles-- no appetite.  History of Present Illness: 77 yo with complicated recent admission to [facility] in 12/11.  She was admitted with progressive exertional dyspnea x 3 weeks as well as a large left pleural effusion.  Troponin was 0.11.  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  This was thought to be due to a prior MI (suspect at least 3 wks prior to admission).  LHC was not done due to renal failure and the distant MI. Hospitalization was complicated by CHF as well as by atrial fibrillation with RVR.  Treatment of the atrial fibrillation led to symptomatic bradycardia and she ultimately received a dual chamber Medtronic pacemaker.  She had a left-sided thoracentesis and eventually had a Pleurx catheter placed, which is still in.  Since discharge, she has been living at the [facility].   She is short of breath walking in the halls though she does ok in her room.  No orthopnea or PND.  No chest pain.    ECG: NSR, old anterior and lateral MI  Labs (1/12): K 4.3, creatinine 2.21 => 1.83, HCT 30.6  Preventive Screening-Counseling & Management  Alcohol-Tobacco     Smoking Status: never      Drug Use:  no.    Current Medications (verified): 1)  Amiodarone Hcl 200 Mg Tabs (Amiodarone Hcl) .... Take One Tablet By Mouth Daily 2)  Aspirin 81 Mg Tbec (Aspirin) 3)  Lipitor 40 Mg Tabs (Atorvastatin Calcium) .... Take One Tablet By Mouth Daily. 4)  Carvedilol 12.5 Mg Tabs (Carvedilol) .... Take 1 Tablet Twice Daily 5)  Hydralazine Hcl 50 Mg Tabs (Hydralazine Hcl) .... Take 1 Tab 3times Per Day 6)  Isosorbide Dinitrate 20 Mg Tabs (Isosorbide Dinitrate) .... Take 1 Tablet Three Times/day 7)  Calcium Acetate 667 Mg Tabs (Calcium Acetate (Phos Binder)) .... Three Times A Day 8)  Warfarin Sodium 1 Mg Tabs (Warfarin Sodium) .... Use As Directed By Anticoagualtion Clinic 9)  Nitrostat 0.4 Mg  Subl (Nitroglycerin) .Marland Kitchen.. 1 Tablet Under Tongue At Onset of Chest Pain; You May Repeat Every 5 Minutes For Up To 3 Doses. 10)  Furosemide 40 Mg Tabs (Furosemide)  Allergies (verified): 1)  ! Pcn  Past History:  Past Medical History: 1. Paroxysmal atrial fibrillation: Maintaining NSR with amiodarone.  On Warfarin.  2. Systolic CHF: Presumed ischemic cardiomyopathy.  Echo (12/11) with EF 30-35%, mild LV hypertrophy, apical aneurysm, mild AI, PA systolic pressure 39 mmHg.  3. Hypertension 4. CAD: Suspect prior anterior MI.  No left heart cath done at 12/11 admission as MI was probably old and creatinine was elevated.  5. Tachy-brady syndrome: Medtronic dual chamber PCM 1/12 6. CKD 7. Anemia: FOBT negative 8. History of hysterectomy 9. Left pleural effusion  Past Surgical History: Pacemaker Abdominal Hysterectomy-Total  Family History: Reviewed history and no changes required. Family History of Cancer Family History of Coronary Artery Disease:   Social History: Tobacco Use - No.  Alcohol Use - no Drug Use - no Retired  From [employer].  Currently living at the [facility].  Smoking Status:  never Drug Use:  no  Review of Systems       All systems reviewed and negative except as per HPI.   Vital Signs:  Patient profile:   77 year old female Height:      63 inches Weight:  123 pounds BMI:     21.87 Pulse rate:   77 / minute BP sitting:   128 / 56  (right arm) Cuff size:   regular  Vitals Entered By: Hardin Negus, RMA (November 18, 2010 11:26 AM)  Physical Exam  General:  Frail elderly woman in no distress Head:  normocephalic and atraumatic Nose:  no deformity, discharge, inflammation, or lesions Mouth:  Teeth, gums and palate normal. Oral mucosa normal. Neck:  Neck supple, JVP 8-9 cm. No masses, thyromegaly or abnormal cervical nodes. Lungs:  Decreased breath sounds and crackles at the left base.  Heart:  Non-displaced PMI, chest non-tender; regular rate and  rhythm, S1, S2 without rubs or gallops. 2/6 SEM RUSB, 2/6 HSM at apex.  Carotid upstroke normal, no bruit.  Pedals normal pulses. 1+ ankle edema with lower leg varicosities.  Abdomen:  Bowel sounds positive; abdomen soft and non-tender without masses, organomegaly, or hernias noted. No hepatosplenomegaly. Extremities:  No clubbing or cyanosis. Neurologic:  Alert and oriented x 3. Skin:  Intact without lesions or rashes. Psych:  Normal affect.   PPM Specifications Following MD:  Lewayne Bunting, MD     PPM Vendor:  Medtronic     PPM Model Number:  [id number]     PPM Serial Number:  [id number] PPM DOI:  11/01/2010     PPM Implanting MD:  Lewayne Bunting, MD  Lead 1    Location: RA     DOI: 11/01/2010     Model #: [id number]     Serial #: [id number]     Status: active Lead 2    Location: RV     DOI: 11/01/2010     Model #: [id number]     Serial #: [id number]     Status: active  Magnet Response Rate:  BOL 85 ERI 65  Indications:  Sick sinus syndrome   Impression & Recommendations:  Problem # 1:  SYSTOLIC HEART FAILURE, CHRONIC (ICD-428.22) Suspect ischemic cardiomyopathy.  EF 30-35%.  Patient does have some volume overload on exam and symptoms are NYHA class III.   - Increase Lasix to 40 mg daily with BMET/BNP in 2 wks. - She is on two different beta blockers.  Discontinue metoprolol and increase Coreg to 12.5 mg two times a day.  - Discontinue amlodipine and increase hydralazine to 50 mg three times a day.  Continue isordil 20 mg two times a day.   - Needs to follow low sodium diet.   Problem # 2:  CAD, NATIVE VESSEL (ICD-414.01) Echo and ECG suggest prior MI in the LAD territory.  I suspect based on symptoms that this occurred 2-3 weeks prior to admission.  She has had no chest pain or unstable symptoms and she has significant CKD, so I think that we can continue to hold off on LHC. OK to decrease ASA to 81 mg daily.  Continue statin.   Problem # 3:  ATRIAL FIBRILLATION (ICD-427.31) Paroxysmal.  Currently  in NSR on amiodarone.  Will check TSH and LFTs.  Will also get baseline PFTs.   Other Orders: TLB-TSH (Thyroid Stimulating Hormone) (84443-TSH) TLB-Hepatic/Liver Function Pnl (80076-HEPATIC)  Patient Instructions: 1)  Your physician recommends that you schedule a follow-up appointment in: 3 WEEKS 2)  Your physician recommends that you return for lab work in: bmet/bnp  2 weeks 3)  Your physician has recommended you make the following change in your medication: PLEASE SEE YOUR NEW MED LIST 4)  Your physician has recommended that you have a pulmonary  function test.  Pulmonary Function Tests are a group of tests that measure how well air moves in and out of your lungs.--PLEASE SCHED FOR 1 MONTH

## 2010-11-27 NOTE — Progress Notes (Signed)
Summary: rteurning call about labs   Phone Note Call from Patient   Caller: [facility]/ Debbora Lacrosse [phone] Summary of Call: [facility] returning call about pt labs being low Initial call taken by: Judie Grieve,  November 22, 2010 1:14 PM  Follow-up for Phone Call        I talked with Kathie Rhodes about liver profile done 11/18/10 --she is aware that a repeat liver profile is scheduled for 12/13/10

## 2010-11-27 NOTE — Progress Notes (Signed)
Summary: [facility] calling re order  Phone Note From Other Clinic   Caller: [facility] [phone] susanne or nikki Summary of Call: pt was in today and came back to nursing home with order for a walker and they just progressed her to a single point cane, they feel the sister may have gotten the dr to write the order but they feel the it would be in the pt's best interest to use her can and want tosee if the order can be changed?  Initial call taken by: Glynda Jaeger,  November 18, 2010 2:25 PM  Follow-up for Phone Call        PT is calling @ the [facility] and said there was an order written from our office for them to use a walker but that Ms. Surges can use a cane and does well with this. They need some clarification. I explained to them to use whatever is best for the patient and that I would forward this to Dr. Shirlee Latch. They are taking the necessary precautions to prevent her from falling. Whitney Maeola Sarah RN  November 18, 2010 2:54 PM  Follow-up by: Whitney Maeola Sarah RN,  November 18, 2010 2:54 PM     Appended Document: bryan calling re order May use cane if they feel she is safe with it.   Appended Document: bryan calling re order 11/19/10--0900am--called [facility] and informed "barbara", ms craddockk's nurse that she may walk with cane--nt

## 2010-11-28 DIAGNOSIS — I4891 Unspecified atrial fibrillation: Secondary | ICD-10-CM

## 2010-11-28 NOTE — Op Note (Signed)
[name], [name] NO.:  [id number]  MEDICAL RECORD NO.:  [id number]          PATIENT TYPE:  INP  LOCATION:  3701                         FACILITY:  [facility]  PHYSICIAN:  Doylene Canning. Ladona Ridgel, MD    DATE OF BIRTH:  [birth year]  DATE OF PROCEDURE:  11/01/2010 DATE OF DISCHARGE:                              OPERATIVE REPORT   PROCEDURE PERFORMED:  Insertion of dual-chamber pacemaker.  INDICATIONS:  Symptomatic tachybrady syndrome with atrial fibrillation and rapid ventricular response along with sinus bradycardia with pauses up to 5 seconds.  INTRODUCTION:  The patient is an 77 year old woman who had an out-of- hospital MI just over a month ago by her symptoms.  She has LV dysfunction and heart failure.  She has had worsening palpitations and atrial fibrillation with a rapid ventricular response.  On low-dose amiodarone, she has had recurrence of AFib with pauses posttermination of up to 5 seconds.  She is now referred for permanent pacemaker insertion.  It should be noted that in AFib she feels fast with a rapid ventricular response at rates of over 130 beats per minute.  PROCEDURE:  After informed consent obtained, the patient was taken to the Diagnostic EP Lab in a fasting state.  After usual preparation and draping, intravenous fentanyl and midazolam was given for sedation.  30 mL of lidocaine was infiltrated into the left infraclavicular region.  A 5-cm incision was carried out over this region.  Electrocautery was utilized to dissect down to the fascial plane.  The left subclavian vein was then punctured x2 and the Medtronic model 5076, 52-cm active fixation pacing lead, serial number [id number] was advanced into the right ventricle and the Medtronic model 5076, 45-cm active fixation pacing lead, serial number [id number] was advanced to the right atrium. Mapping was carried out in the right ventricle and at the final site the R-waves measured 12 mV and with  lead actively fixed the threshold was a volt at 0.5 milliseconds.  The impedance was 550 ohms.  There was a moderate injury current with active fixation of the lead and 10-volt pacing did not stimulate the diaphragm.  With the ventricular lead in satisfactory position, attention was then turned to the placement of the atrial lead and was placed in the anterolateral portion of the right atrium where P-waves (fibrillation waves) measured between 1 and 2 mV and the pacing impedance with lead actively fixed in the AOO mode was 517 ohms.  Again, 10-volt pacing did not stimulate the diaphragm and again there was a moderate injury current with active fixation of the lead.  With these satisfactory parameters, the leads were secured to the subpectoralis fascia with a figure-of-eight silk suture.  The sewing sleeve was secured with silk suture.  Electrocautery was utilized to make a subcutaneous pocket.  Antibiotic irrigation was utilized to irrigate the pocket.  The Medtronic Sensia dual-chamber pacemaker, serial number [id number] was connected to the atrial and RV leads and placed back into the subcutaneous pocket where it was secured with silk suture.  The pocket was irrigated with antibiotic irrigation and the incision was then closed with 2-0 and 3-0 Vicryl.  Benzoin and Steri- Strips were painted on the skin.  A pressure dressing was applied, and the patient was returned to her room in satisfactory condition.  COMPLICATIONS:  There were no immediate procedure complications.  RESULTS:  This demonstrates successful implantation of Medtronic dual- chamber pacemaker in a patient with symptomatic tachybrady syndrome.     Doylene Canning. Ladona Ridgel, MD     GWT/MEDQ  D:  11/01/2010  T:  11/02/2010  Job:  [id number]  cc:   Marca Ancona, MD Jesse Sans. Daleen Squibb, MD, [facility]  Electronically Signed by Lewayne Bunting MD on 11/28/2010 05:14:35 PM

## 2010-11-29 ENCOUNTER — Encounter: Payer: Self-pay | Admitting: Cardiology

## 2010-12-02 ENCOUNTER — Encounter: Payer: Self-pay | Admitting: Physician Assistant

## 2010-12-03 ENCOUNTER — Other Ambulatory Visit: Payer: Self-pay | Admitting: Thoracic Surgery

## 2010-12-03 ENCOUNTER — Ambulatory Visit
Admission: RE | Admit: 2010-12-03 | Discharge: 2010-12-03 | Disposition: A | Discharge status: Home / Self Care | Payer: Medicare Other | Source: Ambulatory Visit | Attending: Thoracic Surgery | Admitting: Thoracic Surgery

## 2010-12-03 ENCOUNTER — Encounter (INDEPENDENT_AMBULATORY_CARE_PROVIDER_SITE_OTHER): Payer: Medicare Other | Admitting: Thoracic Surgery

## 2010-12-03 ENCOUNTER — Encounter: Payer: Self-pay | Admitting: Cardiology

## 2010-12-03 ENCOUNTER — Encounter: Payer: Self-pay | Admitting: Thoracic Surgery

## 2010-12-03 DIAGNOSIS — J90 Pleural effusion, not elsewhere classified: Secondary | ICD-10-CM

## 2010-12-03 DIAGNOSIS — J910 Malignant pleural effusion: Secondary | ICD-10-CM

## 2010-12-04 NOTE — Letter (Signed)
December 03, 2010  Marca Ancona, MD [address] [place], [place] [address]  Re:  [name], [name]             DOB:  1926-01-06  Dear Dr. Shirlee Latch:  I saw the patient back today and she is now draining less than 200 mL from her PleurX.  Her blood pressure was 143/61, her pulse 60, respirations 20, and sats were 94%.  We have gone ahead and arranged for her to remove her PleurX and do a talc pleurodesis on the 27th assuming her drainings staying the same.  I appreciate the opportunity of seeing the patient.  Sincerely,  Ines Bloomer, M.D. Electronically Signed  DPB/MEDQ  D:  12/03/2010  T:  12/04/2010  Job:  [id number]

## 2010-12-04 NOTE — Discharge Summary (Signed)
  [name], [name] NO.:  [id number]  MEDICAL RECORD NO.:  [id number]          PATIENT TYPE:  INP  LOCATION:  3701                         FACILITY:  [facility]  PHYSICIAN:  Jesse Sans. Kielyn Kardell, MD, FACCDATE OF BIRTH:  1926-04-19  DATE OF ADMISSION:  10/15/2010 DATE OF DISCHARGE:                              DISCHARGE SUMMARY   ADDENDUM: This is an addendum to the discharge summary which was completed, where I dictated that the patient's chest tube drain should be drained on Monday, Wednesday, and Friday; that is incorrect, please note that it should be drained on Tuesdays and Saturdays.     Jarrett Ables, PAC   ______________________________ Jesse Sans. Daleen Squibb, MD, [facility]    MS/MEDQ  D:  11/04/2010  T:  11/04/2010  Job:  [id number]  Electronically Signed by Jarrett Ables PAC on 11/27/2010 02:15:56 PM Electronically Signed by Valera Castle MD [facility] on 12/04/2010 09:36:46 AM

## 2010-12-05 NOTE — Cardiovascular Report (Signed)
Summary: Office Visit   Office Visit   Imported By: Roderic Ovens 11/29/2010 10:57:56  _____________________________________________________________________  External Attachment:    Type:   Image     Comment:   External Document

## 2010-12-05 NOTE — Procedures (Signed)
Summary: Cardiology Device Clinic   Current Medications (verified): 1)  Amiodarone Hcl 200 Mg Tabs (Amiodarone Hcl) .... Take One Tablet By Mouth Daily 2)  Aspirin 81 Mg Tbec (Aspirin) 3)  Lipitor 40 Mg Tabs (Atorvastatin Calcium) .... Take One Tablet By Mouth Daily. 4)  Carvedilol 12.5 Mg Tabs (Carvedilol) .... Take 1 Tablet Twice Daily 5)  Hydralazine Hcl 50 Mg Tabs (Hydralazine Hcl) .... Take 1 Tab 3times Per Day 6)  Isosorbide Dinitrate 20 Mg Tabs (Isosorbide Dinitrate) .... Take 1 Tablet Three Times/day 7)  Calcium Acetate 667 Mg Tabs (Calcium Acetate (Phos Binder)) .... Three Times A Day 8)  Warfarin Sodium 1 Mg Tabs (Warfarin Sodium) .... Use As Directed By Anticoagualtion Clinic 9)  Nitrostat 0.4 Mg Subl (Nitroglycerin) .Marland Kitchen.. 1 Tablet Under Tongue At Onset of Chest Pain; You May Repeat Every 5 Minutes For Up To 3 Doses. 10)  Furosemide 40 Mg Tabs (Furosemide)  Allergies (verified): 1)  ! Pcn  PPM Specifications Following MD:  Lewayne Bunting, MD     PPM Vendor:  Medtronic     PPM Model Number:  [id number]     PPM Serial Number:  [id number] PPM DOI:  11/01/2010     PPM Implanting MD:  Lewayne Bunting, MD  Lead 1    Location: RA     DOI: 11/01/2010     Model #: [id number]     Serial #: [id number]     Status: active Lead 2    Location: RV     DOI: 11/01/2010     Model #: 9528     Serial #: [id number]     Status: active  Magnet Response Rate:  BOL 85 ERI 65  Indications:  Sick sinus syndrome   PPM Follow Up Battery Voltage:  2.79 V     Battery Est. Longevity:  12.5 yrs       PPM Device Measurements Atrium  Amplitude: 5.60 mV, Impedance: 498 ohms, Threshold: 0.50 V at 0.40 msec Right Ventricle  Amplitude: 22.40 mV, Impedance: 476 ohms, Threshold: 0.250 V at 0.40 msec  Episodes MS Episodes:  41     Percent Mode Switch:  0.4%     Coumadin:  Yes Ventricular High Rate:  3     Atrial Pacing:  17.1%     Ventricular Pacing:  0.4%  Parameters Mode:  DDI     Lower Rate Limit:  60     Paced AV  Delay:  350     Next Cardiology Appt Due:  01/20/2011 Tech Comments:  WOUND CHECK---STERI STRIPS REMOVED.  NO REDNESS OR SWELLING AT SITE.  41 MODE SWITCHES--LONGEST WAS 32 MINUTES. + COUMADIN.  NORMAL DEVICE FUNCTION.  NO CHANGES MADE. ROV IN 3 MTHS W/GT IN RDS. [name]  November 20, 2010 5:34 PM

## 2010-12-13 ENCOUNTER — Telehealth (INDEPENDENT_AMBULATORY_CARE_PROVIDER_SITE_OTHER): Payer: Self-pay | Admitting: *Deleted

## 2010-12-13 ENCOUNTER — Encounter: Payer: Self-pay | Admitting: Physician Assistant

## 2010-12-13 ENCOUNTER — Other Ambulatory Visit (INDEPENDENT_AMBULATORY_CARE_PROVIDER_SITE_OTHER): Payer: Medicare Other

## 2010-12-13 ENCOUNTER — Ambulatory Visit (INDEPENDENT_AMBULATORY_CARE_PROVIDER_SITE_OTHER): Payer: Medicare Other | Admitting: Cardiology

## 2010-12-13 ENCOUNTER — Encounter: Payer: Self-pay | Admitting: Cardiology

## 2010-12-13 ENCOUNTER — Other Ambulatory Visit: Payer: Self-pay | Admitting: Cardiology

## 2010-12-13 DIAGNOSIS — I5022 Chronic systolic (congestive) heart failure: Secondary | ICD-10-CM

## 2010-12-13 DIAGNOSIS — I2510 Atherosclerotic heart disease of native coronary artery without angina pectoris: Secondary | ICD-10-CM

## 2010-12-13 DIAGNOSIS — I4891 Unspecified atrial fibrillation: Secondary | ICD-10-CM

## 2010-12-13 DIAGNOSIS — I509 Heart failure, unspecified: Secondary | ICD-10-CM

## 2010-12-13 LAB — HEPATIC FUNCTION PANEL
ALT: 26 U/L (ref 0–35)
Alkaline Phosphatase: 94 U/L (ref 39–117)
Bilirubin, Direct: 0.1 mg/dL (ref 0.0–0.3)
Total Protein: 6.3 g/dL (ref 6.0–8.3)

## 2010-12-13 LAB — BASIC METABOLIC PANEL
CO2: 32 mEq/L (ref 19–32)
Calcium: 9 mg/dL (ref 8.4–10.5)
GFR: 17.22 mL/min — ABNORMAL LOW (ref >60.00)
Sodium: 136 mEq/L (ref 135–145)

## 2010-12-13 LAB — CBC WITH DIFFERENTIAL/PLATELET
Basophils Relative: 0.4 % (ref 0.0–3.0)
Eosinophils Relative: 4.4 % (ref 0.0–5.0)
Lymphocytes Relative: 15.9 % (ref 12.0–46.0)
Neutrophils Relative %: 72.5 % (ref 43.0–77.0)
RBC: 3.88 Mil/uL (ref 3.87–5.11)
WBC: 8.4 10*3/uL (ref 4.5–10.5)

## 2010-12-13 LAB — BRAIN NATRIURETIC PEPTIDE: Pro B Natriuretic peptide (BNP): 2568.8 pg/mL — ABNORMAL HIGH (ref 0.0–100.0)

## 2010-12-16 ENCOUNTER — Ambulatory Visit (HOSPITAL_COMMUNITY)
Admission: RE | Admit: 2010-12-16 | Discharge: 2010-12-16 | Disposition: A | Discharge status: Home / Self Care | Payer: Medicare Other | Source: Ambulatory Visit | Attending: Thoracic Surgery | Admitting: Thoracic Surgery

## 2010-12-16 DIAGNOSIS — J90 Pleural effusion, not elsewhere classified: Secondary | ICD-10-CM | POA: Insufficient documentation

## 2010-12-16 DIAGNOSIS — Z4682 Encounter for fitting and adjustment of non-vascular catheter: Secondary | ICD-10-CM | POA: Insufficient documentation

## 2010-12-17 NOTE — Assessment & Plan Note (Addendum)
Summary: 3 wks   Primary Provider:  Dr. Olena Leatherwood  CC:  follow up. Pt in hospital in Feb. for CHF.  pt .  History of Present Illness: 77 yo with complicated admission to [facility] in 12/11.  She was admitted with progressive exertional dyspnea x 3 weeks as well as a large left pleural effusion.  Troponin was 0.11.  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  This was thought to be due to a prior MI (suspect at least 3 wks prior to admission).  LHC was not done due to renal failure and the distant MI. Hospitalization was complicated by CHF as well as by atrial fibrillation with RVR.  Treatment of the atrial fibrillation led to symptomatic bradycardia and she ultimately received a dual chamber Medtronic pacemaker.  She had a left-sided thoracentesis and eventually had a Pleurx catheter placed, which is still in.  She is to have this removed and to get a talc pleurodesis on 2/27.    She was admitted to [facility] 2/6 with anemia and CHF exacerbation.  I do not have the discharge summary available.  She did receive a transfusion.  Lasix was stopped and torsemide begun, and warfarin and ASA were stopped.  She says that she was in the hospital for about a week.  After discharge, she had a colonoscopy.  She says that she was told that there were no significant abnormalities.   Since returning to the [facility], she has been less short of breath.  She is able to walk the halls with her walker without dyspnea.  Weight is 3 lbs less compared to prior appointment.  No chest pain, orthopnea, or PND.  Labs (1/12): K 4.3, creatinine 2.21 => 1.83, HCT 30.6 Labs (2/12, today): K 3.1, creatinine 2.8, BNP 2568, TSH normal, LFTs normal  Current Medications (verified): 1)  Amiodarone Hcl 200 Mg Tabs (Amiodarone Hcl) .... Take One Tablet By Mouth Daily 2)  Lipitor 40 Mg Tabs (Atorvastatin Calcium) .... Take One Tablet By Mouth Daily. 3)  Carvedilol 12.5 Mg Tabs (Carvedilol) .... Take 1 Tablet Twice  Daily 4)  Hydralazine Hcl 25 Mg Tabs (Hydralazine Hcl) .... Take One Tablet Three Times A Day 5)  Isosorbide Dinitrate 20 Mg Tabs (Isosorbide Dinitrate) .... Take 1 Tablet Three Times/day 6)  Calcium Acetate 667 Mg Tabs (Calcium Acetate (Phos Binder)) .... Three Times A Day 7)  Nitrostat 0.4 Mg Subl (Nitroglycerin) .Marland Kitchen.. 1 Tablet Under Tongue At Onset of Chest Pain; You May Repeat Every 5 Minutes For Up To 3 Doses. 8)  Demadex 20 Mg Tabs (Torsemide) .... Take 2 Tablets Two Times A Day 9)  Vitamin C 500 Mg Tabs (Ascorbic Acid) .... Take One Tablet Two Times A Day 10)  Ferrous Sulfate 325 (65 Fe) Mg Tabs (Ferrous Sulfate) .... Take One Tablet Every 8 Hrs  Allergies (verified): 1)  ! Pcn  Past History:  Past Medical History: 1. Paroxysmal atrial fibrillation: Maintaining NSR with amiodarone.  Warfarin stopped due to anemia.  2. Systolic CHF: Presumed ischemic cardiomyopathy.  Echo (12/11) with EF 30-35%, mild LV hypertrophy, apical aneurysm, mild AI, PA systolic pressure 39 mmHg.  3. Hypertension 4. CAD: Suspect prior anterior MI.  No left heart cath done at 12/11 admission as MI was probably old and creatinine was elevated.  5. Tachy-brady syndrome: Medtronic dual chamber PCM 1/12 6. CKD 7. Anemia: FOBT negative.  Per patient, colonoscopy in 2/12 was unremarkable.   8. History of hysterectomy  9. Left pleural effusion  Family History: Reviewed history from 11/18/2010 and no changes required. Family History of Cancer Family History of Coronary Artery Disease:   Social History: Reviewed history from 11/18/2010 and no changes required. Tobacco Use - No.  Alcohol Use - no Drug Use - no Retired  From [employer].  Currently living at the [facility].   Review of Systems       All systems reviewed and negative except as per HPI.   Vital Signs:  Patient profile:   77 year old female Height:      63 inches Weight:      120 pounds BMI:     21.33 Pulse rate:   72 / minute Pulse rhythm:    regular BP sitting:   130 / 50  (left arm) Cuff size:   regular  Vitals Entered By: Judithe Modest CMA (December 13, 2010 10:08 AM)  Physical Exam  General:  Frail elderly woman in no distress Neck:  Neck supple, JVP 7 cm. No masses, thyromegaly or abnormal cervical nodes. Lungs:  Decreased breath sounds and crackles at the left base.  Mild crackles right base.  Heart:  Non-displaced PMI, chest non-tender; regular rate and rhythm, S1, S2 without rubs or gallops. 2/6 SEM RUSB, 2/6 HSM at apex.  Carotid upstroke normal, no bruit.  Pedals normal pulses. Trace ankle edema with lower leg varicosities.  Abdomen:  Bowel sounds positive; abdomen soft and non-tender without masses, organomegaly, or hernias noted. No hepatosplenomegaly. Extremities:  No clubbing or cyanosis. Neurologic:  Alert and oriented x 3. Psych:  Normal affect.   PPM Specifications Following MD:  Lewayne Bunting, MD     PPM Vendor:  Medtronic     PPM Model Number:  [id number]     PPM Serial Number:  [id number] PPM DOI:  11/01/2010     PPM Implanting MD:  Lewayne Bunting, MD  Lead 1    Location: RA     DOI: 11/01/2010     Model #: [id number]     Serial #: [id number]     Status: active Lead 2    Location: RV     DOI: 11/01/2010     Model #: 9629     Serial #: [id number]     Status: active  Magnet Response Rate:  BOL 85 ERI 65  Indications:  Sick sinus syndrome   Episodes Coumadin:  Yes  Parameters Mode:  DDI     Lower Rate Limit:  60     Paced AV Delay:  350     Impression & Recommendations:  Problem # 1:  SYSTOLIC HEART FAILURE, CHRONIC (ICD-428.22) Recent admission to [facility] with CHF exacerbation with change of diuretic to torsemide.  Patient is hypokalemic with acute on chronic renal failure.  Thought BNP is elevated, this may be partially a function of elevated creatinine.  On exam, she does not seem significantly volume overloaded.   - Continue Coreg, hydralazine, isordil - Hold torsemide today and tomorrow.  Restart on  Sunday at dose of 40 mg daily (rather than two times a day).  Add KCl 40 mEq daily.  - BMET in 1 week.   Problem # 2:  CAD, NATIVE VESSEL (ICD-414.01) Echo and ECG suggest prior MI in the LAD territory.  I suspect based on symptoms that this occurred 2-3 weeks prior to her initial admission.  She has had no chest pain or unstable symptoms and she has significant CKD, so I think that we can continue  to hold off on LHC. Continue statin and resume ASA 81 mg daily given no overt GI bleeding and apparently unremarkable colonoscopy.   Problem # 3:  ATRIAL FIBRILLATION (ICD-427.31) Patient remains in NSR on amiodarone.  Coumadin was stopped due to anemia/transfusion.  Patient is on ASA 81 mg daily for now.  LFTs and TSH on amiodarone were normal.  Will get baseline PFTs.    Problem # 4:  PACEMAKER PCM for sick sinus syndrome.    Patient is living at the [facility] in [place].  I am going to try to transfer both her MD followup and her PCM followup to the [facility] office for her convenience.  She will need to be seen in 2 weeks.   Other Orders: TLB-BMP (Basic Metabolic Panel-BMET) (80048-METABOL) TLB-TSH (Thyroid Stimulating Hormone) (84443-TSH) TLB-CBC Platelet - w/Differential (85025-CBCD) TLB-Hepatic/Liver Function Pnl (80076-HEPATIC) TLB-BNP (B-Natriuretic Peptide) (83880-BNPR)  Patient Instructions: 1)  Your physician recommends that you schedule a follow-up appointment in: 1 month in [place] office--de[facility] or [facility]--also sched cvrr appoints in [place] 2)  Your physician recommends that you return for lab work in: TODAY--cbc,tsh,bnp,bmet,lft's 3)  Your physician has recommended you make the following change in your medication: please start 81mg  baby asapirin

## 2010-12-17 NOTE — Progress Notes (Signed)
Summary: Cardiology Phone Note - Med Clarification  Phone Note Call from Patient   Caller: Patient Summary of Call: Received call from Angie at [facility] to clarify orders for medication change - the orders she received were for changes to Lasix, wanted to clarify that the patient was on Demadex and not Lasix. Records indicate she is on Demadex, clarified this order change. Will forward to Dr. Shirlee Latch for review in case changes are desired. Initial call taken by: Ronie Spies PA-C

## 2010-12-18 ENCOUNTER — Encounter (INDEPENDENT_AMBULATORY_CARE_PROVIDER_SITE_OTHER): Payer: Medicare Other

## 2010-12-18 ENCOUNTER — Encounter: Payer: Self-pay | Admitting: Cardiology

## 2010-12-18 ENCOUNTER — Encounter: Payer: Self-pay | Admitting: Internal Medicine

## 2010-12-18 DIAGNOSIS — R0609 Other forms of dyspnea: Secondary | ICD-10-CM

## 2010-12-18 DIAGNOSIS — R0989 Other specified symptoms and signs involving the circulatory and respiratory systems: Secondary | ICD-10-CM | POA: Insufficient documentation

## 2010-12-20 ENCOUNTER — Encounter: Payer: Self-pay | Admitting: Physician Assistant

## 2010-12-20 ENCOUNTER — Encounter: Payer: Self-pay | Admitting: Cardiology

## 2010-12-23 ENCOUNTER — Other Ambulatory Visit: Payer: Self-pay | Admitting: Thoracic Surgery

## 2010-12-23 ENCOUNTER — Encounter: Payer: Self-pay | Admitting: Cardiology

## 2010-12-23 DIAGNOSIS — J90 Pleural effusion, not elsewhere classified: Secondary | ICD-10-CM

## 2010-12-24 ENCOUNTER — Encounter: Payer: Self-pay | Admitting: Cardiology

## 2010-12-24 ENCOUNTER — Ambulatory Visit
Admission: RE | Admit: 2010-12-24 | Discharge: 2010-12-24 | Disposition: A | Discharge status: Home / Self Care | Payer: Medicare Other | Source: Ambulatory Visit | Attending: Thoracic Surgery | Admitting: Thoracic Surgery

## 2010-12-24 ENCOUNTER — Encounter (INDEPENDENT_AMBULATORY_CARE_PROVIDER_SITE_OTHER): Payer: Medicare Other | Admitting: Thoracic Surgery

## 2010-12-24 ENCOUNTER — Ambulatory Visit (INDEPENDENT_AMBULATORY_CARE_PROVIDER_SITE_OTHER): Payer: Medicare Other | Admitting: Cardiology

## 2010-12-24 ENCOUNTER — Telehealth (INDEPENDENT_AMBULATORY_CARE_PROVIDER_SITE_OTHER): Payer: Self-pay | Admitting: *Deleted

## 2010-12-24 DIAGNOSIS — I428 Other cardiomyopathies: Secondary | ICD-10-CM

## 2010-12-24 DIAGNOSIS — I4891 Unspecified atrial fibrillation: Secondary | ICD-10-CM

## 2010-12-24 DIAGNOSIS — I2510 Atherosclerotic heart disease of native coronary artery without angina pectoris: Secondary | ICD-10-CM

## 2010-12-24 DIAGNOSIS — J90 Pleural effusion, not elsewhere classified: Secondary | ICD-10-CM

## 2010-12-24 NOTE — Assessment & Plan Note (Signed)
OFFICE VISIT  RAINY, ROTHMAN A DOB:  03-19-1926                                        December 24, 2010 CHART #:  [id number]  She came today and she is doing well.  Her chest x-ray shows a left lower lobe reaction from the Pleurx and talc pleurodesis.  I did not see any free flow or effusion.  We have removed her chest tube since.  Her blood pressure is 114/67, pulse 67, sats were 94%.  I will see her back again in 3 weeks with a chest x-ray.  Ines Bloomer, M.D. Electronically Signed  DPB/MEDQ  D:  12/24/2010  T:  12/24/2010  Job:  [id number]

## 2010-12-26 NOTE — Letter (Signed)
Summary: Cytopathology  Cytopathology   Imported By: Marylou Mccoy 12/16/2010 14:43:26  _____________________________________________________________________  External Attachment:    Type:   Image     Comment:   External Document

## 2010-12-26 NOTE — Assessment & Plan Note (Signed)
Summary: AMNIODERONE THERAPY/CB     PER DR MCLEAN   Allergies: 1)  ! Pcn   Other Orders: Carbon Monoxide diffusing w/capacity (16109) Lung Volumes/Gas dilution or washout (60454) Spirometry (Pre & Post) [phone])

## 2010-12-26 NOTE — Miscellaneous (Signed)
Summary: REPEAT BMET  Clinical Lists Changes  Orders: Added new Test order of T-Basic Metabolic Panel [phone]) - Signed

## 2010-12-30 LAB — BASIC METABOLIC PANEL
BUN: 34 mg/dL — ABNORMAL HIGH (ref 6–23)
BUN: 35 mg/dL — ABNORMAL HIGH (ref 6–23)
BUN: 37 mg/dL — ABNORMAL HIGH (ref 6–23)
BUN: 56 mg/dL — ABNORMAL HIGH (ref 6–23)
BUN: 57 mg/dL — ABNORMAL HIGH (ref 6–23)
CO2: 28 mEq/L (ref 19–32)
Calcium: 7.8 mg/dL — ABNORMAL LOW (ref 8.4–10.5)
Calcium: 8.1 mg/dL — ABNORMAL LOW (ref 8.4–10.5)
Calcium: 8.2 mg/dL — ABNORMAL LOW (ref 8.4–10.5)
Calcium: 8.2 mg/dL — ABNORMAL LOW (ref 8.4–10.5)
Chloride: 100 mEq/L (ref 96–112)
Chloride: 102 mEq/L (ref 96–112)
Chloride: 95 mEq/L — ABNORMAL LOW (ref 96–112)
Chloride: 97 mEq/L (ref 96–112)
Creatinine, Ser: 2.4 mg/dL — ABNORMAL HIGH (ref 0.4–1.2)
Creatinine, Ser: 2.42 mg/dL — ABNORMAL HIGH (ref 0.4–1.2)
Creatinine, Ser: 2.59 mg/dL — ABNORMAL HIGH (ref 0.4–1.2)
Creatinine, Ser: 2.72 mg/dL — ABNORMAL HIGH (ref 0.4–1.2)
Creatinine, Ser: 3.3 mg/dL — ABNORMAL HIGH (ref 0.4–1.2)
GFR calc Af Amer: 20 mL/min — ABNORMAL LOW (ref >60)
GFR calc Af Amer: 21 mL/min — ABNORMAL LOW (ref >60)
GFR calc Af Amer: 23 mL/min — ABNORMAL LOW (ref >60)
GFR calc non Af Amer: 17 mL/min — ABNORMAL LOW (ref >60)
GFR calc non Af Amer: 18 mL/min — ABNORMAL LOW (ref >60)
GFR calc non Af Amer: 18 mL/min — ABNORMAL LOW (ref >60)
Glucose, Bld: 100 mg/dL — ABNORMAL HIGH (ref 70–99)
Glucose, Bld: 120 mg/dL — ABNORMAL HIGH (ref 70–99)
Glucose, Bld: 129 mg/dL — ABNORMAL HIGH (ref 70–99)
Potassium: 3.5 mEq/L (ref 3.5–5.1)
Potassium: 5.9 mEq/L — ABNORMAL HIGH (ref 3.5–5.1)
Sodium: 135 mEq/L (ref 135–145)
Sodium: 137 mEq/L (ref 135–145)

## 2010-12-30 LAB — CBC
HCT: 32.5 % — ABNORMAL LOW (ref 36.0–46.0)
Hemoglobin: 10 g/dL — ABNORMAL LOW (ref 12.0–15.0)
Hemoglobin: 9.6 g/dL — ABNORMAL LOW (ref 12.0–15.0)
MCH: 29.6 pg (ref 26.0–34.0)
MCH: 29.9 pg (ref 26.0–34.0)
MCH: 30.3 pg (ref 26.0–34.0)
MCH: 31 pg (ref 26.0–34.0)
MCHC: 31.9 g/dL (ref 30.0–36.0)
MCHC: 32 g/dL (ref 30.0–36.0)
MCHC: 33.7 g/dL (ref 30.0–36.0)
MCV: 92 fL (ref 78.0–100.0)
MCV: 92.5 fL (ref 78.0–100.0)
MCV: 92.6 fL (ref 78.0–100.0)
MCV: 92.7 fL (ref 78.0–100.0)
Platelets: 266 10*3/uL (ref 150–400)
Platelets: 283 10*3/uL (ref 150–400)
Platelets: 293 10*3/uL (ref 150–400)
Platelets: 294 10*3/uL (ref 150–400)
Platelets: 296 10*3/uL (ref 150–400)
RBC: 3.04 MIL/uL — ABNORMAL LOW (ref 3.87–5.11)
RBC: 3.27 MIL/uL — ABNORMAL LOW (ref 3.87–5.11)
RBC: 3.28 MIL/uL — ABNORMAL LOW (ref 3.87–5.11)
RDW: 14 % (ref 11.5–15.5)
RDW: 14 % (ref 11.5–15.5)
RDW: 14.2 % (ref 11.5–15.5)
RDW: 15 % (ref 11.5–15.5)
WBC: 10 10*3/uL (ref 4.0–10.5)
WBC: 10.5 10*3/uL (ref 4.0–10.5)
WBC: 10.7 10*3/uL — ABNORMAL HIGH (ref 4.0–10.5)
WBC: 10.8 10*3/uL — ABNORMAL HIGH (ref 4.0–10.5)
WBC: 8.5 10*3/uL (ref 4.0–10.5)
WBC: 9 10*3/uL (ref 4.0–10.5)

## 2010-12-30 LAB — HEMOGLOBIN A1C: Mean Plasma Glucose: 131 mg/dL — ABNORMAL HIGH (ref <117)

## 2010-12-30 LAB — BODY FLUID CELL COUNT WITH DIFFERENTIAL
Eos, Fluid: 4 %
Neutrophil Count, Fluid: 25 % (ref 0–25)
Total Nucleated Cell Count, Fluid: 629 cu mm (ref 0–1000)

## 2010-12-30 LAB — COMPREHENSIVE METABOLIC PANEL
AST: 26 U/L (ref 0–37)
Albumin: 2.6 g/dL — ABNORMAL LOW (ref 3.5–5.2)
Calcium: 8.4 mg/dL (ref 8.4–10.5)
Creatinine, Ser: 2.24 mg/dL — ABNORMAL HIGH (ref 0.4–1.2)
GFR calc Af Amer: 25 mL/min — ABNORMAL LOW (ref >60)
Total Protein: 5.9 g/dL — ABNORMAL LOW (ref 6.0–8.3)

## 2010-12-30 LAB — CARDIAC PANEL(CRET KIN+CKTOT+MB+TROPI)
CK, MB: 3 ng/mL (ref 0.3–4.0)
Relative Index: INVALID (ref 0.0–2.5)
Relative Index: INVALID (ref 0.0–2.5)
Total CK: 45 U/L (ref 7–177)
Total CK: 53 U/L (ref 7–177)
Troponin I: 0.11 ng/mL — ABNORMAL HIGH (ref 0.00–0.06)

## 2010-12-30 LAB — LIPID PANEL
LDL Cholesterol: 67 mg/dL (ref 0–99)
Triglycerides: 57 mg/dL (ref <150)

## 2010-12-30 LAB — HEPARIN LEVEL (UNFRACTIONATED)
Heparin Unfractionated: 0.23 IU/mL — ABNORMAL LOW (ref 0.30–0.70)
Heparin Unfractionated: 0.27 IU/mL — ABNORMAL LOW (ref 0.30–0.70)
Heparin Unfractionated: 0.4 IU/mL (ref 0.30–0.70)
Heparin Unfractionated: 0.45 IU/mL (ref 0.30–0.70)
Heparin Unfractionated: 0.5 IU/mL (ref 0.30–0.70)
Heparin Unfractionated: 0.5 IU/mL (ref 0.30–0.70)
Heparin Unfractionated: 0.93 IU/mL — ABNORMAL HIGH (ref 0.30–0.70)

## 2010-12-30 LAB — PROTIME-INR
INR: 1.25 (ref 0.00–1.49)
INR: 2.49 — ABNORMAL HIGH (ref 0.00–1.49)
Prothrombin Time: 15.9 seconds — ABNORMAL HIGH (ref 11.6–15.2)
Prothrombin Time: 17.7 seconds — ABNORMAL HIGH (ref 11.6–15.2)

## 2010-12-30 LAB — BODY FLUID CULTURE

## 2010-12-30 LAB — TSH: TSH: 0.78 u[IU]/mL (ref 0.350–4.500)

## 2010-12-30 LAB — MAGNESIUM: Magnesium: 2.8 mg/dL — ABNORMAL HIGH (ref 1.5–2.5)

## 2010-12-30 LAB — BRAIN NATRIURETIC PEPTIDE: Pro B Natriuretic peptide (BNP): 2162 pg/mL — ABNORMAL HIGH (ref 0.0–100.0)

## 2010-12-30 LAB — LACTATE DEHYDROGENASE, PLEURAL OR PERITONEAL FLUID: LD, Fluid: 152 U/L — ABNORMAL HIGH (ref 3–23)

## 2010-12-30 LAB — PROTEIN, BODY FLUID: Total protein, fluid: 3.3 g/dL

## 2010-12-30 LAB — LACTATE DEHYDROGENASE: LDH: 245 U/L (ref 94–250)

## 2010-12-30 LAB — PROTEIN, TOTAL: Total Protein: 5.9 g/dL — ABNORMAL LOW (ref 6.0–8.3)

## 2010-12-30 LAB — MRSA PCR SCREENING: MRSA by PCR: NEGATIVE

## 2010-12-31 NOTE — Assessment & Plan Note (Signed)
Summary: Per Dr.McLean needs to start following in [place]-reqt [facility]-...   Allergies: 1)  ! Pcn   PPM Specifications Following MD:  Lewayne Bunting, MD     PPM Vendor:  Medtronic     PPM Model Number:  [id number]     PPM Serial Number:  [id number] PPM DOI:  11/01/2010     PPM Implanting MD:  Lewayne Bunting, MD  Lead 1    Location: RA     DOI: 11/01/2010     Model #: 0865     Serial #: [id number]     Status: active Lead 2    Location: RV     DOI: 11/01/2010     Model #: 8413     Serial #: [id number]     Status: active  Magnet Response Rate:  BOL 85 ERI 65  Indications:  Sick sinus syndrome   Episodes Coumadin:  Yes  Parameters Mode:  DDI     Lower Rate Limit:  60     Paced AV Delay:  350

## 2010-12-31 NOTE — Progress Notes (Signed)
Summary: Faxed records to Darl Pikes at [facility].  Faxed records to Darl Pikes at [facility]. PFT Fax:[phone] Pho:[phone] [name]  December 24, 2010 12:26 PM

## 2010-12-31 NOTE — Medication Information (Signed)
Summary: [facility] D/C MEDICATION SHEET ORDER  [facility] D/C MEDICATION SHEET ORDER   Imported By: Zachary George 12/23/2010 17:28:42  _____________________________________________________________________  External Attachment:    Type:   Image     Comment:   External Document

## 2010-12-31 NOTE — Progress Notes (Signed)
Summary: Office Visit/  TCT LETTER  Office Visit/  TCT LETTER   Imported By: Dorise Hiss 12/24/2010 08:36:42  _____________________________________________________________________  External Attachment:    Type:   Image     Comment:   External Document

## 2010-12-31 NOTE — Letter (Signed)
Summary: [facility] D/C DR. HASANAJ  [facility] D/C DR. HASANAJ   Imported By: Zachary George 12/23/2010 17:28:02  _____________________________________________________________________  External Attachment:    Type:   Image     Comment:   External Document

## 2010-12-31 NOTE — Consult Note (Signed)
Summary: CARDIOLOGY CONSULT/ [facility]  CARDIOLOGY CONSULT/ [facility]   Imported By: Zachary George 12/23/2010 17:27:12  _____________________________________________________________________  External Attachment:    Type:   Image     Comment:   External Document

## 2010-12-31 NOTE — Assessment & Plan Note (Signed)
Summary: [facility]   Visit Type:  Follow-up Referring Provider:  Dewayne Shorter, MD Primary Provider:  Dr. Olena Leatherwood  CC:  CHF and atrial fib.  History of Present Illness: This patient has a complex cardiac history.  She is followed by Dr.McLean.  He was seen her in the [place] office.  Her care has been transferred to the [facility] office.  She needed to be seen within 2 weeks and he was not able to see her in the office here.  I am seeing her for him.  I have reviewed extensive records.  Patient was seen last on December 13, 2009.  At that time her dose of torsemide was to be changed to 40 mg daily.  I am not sure if this was done.  She was continued on amiodarone.  Pulmonary function studies were arranged.  They have been done.  They show moderate restriction, moderate obstruction, and severe decrease in DLCO.  Also since that time she has had a TSH which is slightly elevated.  Low dose Synthroid was started.  Some of these changes may be related to her amiodarone.  Today she feels relatively well.  She has not had significant fluid overload.  Her pleural tube has been removed since the last visit.  There is report of a chest x-ray showing some effusion.  She is to see Dr. Edwyna Shell today.  Renal function also is worse.  She has significant renal insufficiency.  On December 20, 2010 creatinine was 2.7 which is increased.  It was repeated yesterday and creatinine is 2.35.  It is my understanding that she may be on both Lasix and torsemide and her Lasix will be stopped.  Current Medications (verified): 1)  Amiodarone Hcl 200 Mg Tabs (Amiodarone Hcl) .... Take One Tablet By Mouth Daily 2)  Lipitor 40 Mg Tabs (Atorvastatin Calcium) .... Take One Tablet By Mouth Daily. 3)  Carvedilol 12.5 Mg Tabs (Carvedilol) .... Take 1 Tablet Twice Daily 4)  Hydralazine Hcl 25 Mg Tabs (Hydralazine Hcl) .... Take One Tablet Three Times A Day 5)  Isosorbide Dinitrate 20 Mg Tabs (Isosorbide Dinitrate) .... Take 1  Tablet Three Times/day 6)  Nitrostat 0.4 Mg Subl (Nitroglycerin) .Marland Kitchen.. 1 Tablet Under Tongue At Onset of Chest Pain; You May Repeat Every 5 Minutes For Up To 3 Doses. 7)  Demadex 20 Mg Tabs (Torsemide) .... Take 2 Tablets Two Times A Day 8)  Vitamin C 500 Mg Tabs (Ascorbic Acid) .... Take One Tablet Two Times A Day 9)  Ferrous Sulfate 325 (65 Fe) Mg Tabs (Ferrous Sulfate) .... Take One Tablet Every 8 Hrs 10)  Synthroid 50 Mcg Tabs (Levothyroxine Sodium) .Marland Kitchen.. 1 By Mouth Daily 11)  Aspirin 81 Mg  Tabs (Aspirin) .Marland Kitchen.. 1 By Mouth Daily 12)  Glycolax  Powd (Polyethylene Glycol 3350) .... Uad 13)  Phoslo 667 Mg Caps (Calcium Acetate (Phos Binder)) .... 2 By Mouth Three Times A Day 14)  Hydrocodone-Acetaminophen 5-325 Mg Tabs (Hydrocodone-Acetaminophen) .... As Needed 15)  Acetaminophen 500 Mg  Caps (Acetaminophen) .... 2 By Mouth Prn  Allergies (verified): 1)  ! Pcn  Past History:  Past Medical History: 1. Paroxysmal atrial fibrillation: Maintaining NSR with amiodarone.  On Warfarin.  2. Systolic CHF: Presumed ischemic cardiomyopathy.  Echo (12/11) with EF 30-35%, mild LV hypertrophy, apical aneurysm, mild AI, PA systolic pressure 39 mmHg.  3. Hypertension 4. CAD: Suspect prior anterior MI.  No left heart cath done at 12/11 admission as MI was probably old and creatinine was  elevated.  5. Tachy-brady syndrome: Medtronic dual chamber PCM 1/12 Pacemaker permanent 6. CKD stage IV 7. Anemia: FOBT negative  /   transfused 3 units February, 2012 .Marland KitchenMarland Kitchen? component spontaneous right flank bleed ? / Coumadin held  /  / colonoscopy done later with no significant abnormalities found  Coumadin therapy   for atrial fibrillation.... he'll with anemia... hospital... February, 2012 8. History of hysterectomy 9. Left pleural effusion.Marland KitchenMarland KitchenMarland KitchenPleurx catheter February, 2012  /  removed since February 24, 201 to Pulmonary function abnormalities    PFTs February, 2012... moderate restriction, moderate obstruction, severely  decreased DLCO TSH elevated   March, 2012... Synthroid started... may be related to amiodarone Amiodarone therapy    stopped.. December 24, 2010... with severe DLCO abnormality  Review of Systems       Patient denies fever, chills, headache, sweats, rash, change in vision, change in hearing, chest pain, cough, nausea vomiting, urinary symptoms.  All of the systems are reviewed and are negative.  Vital Signs:  Patient profile:   77 year old female Height:      63 inches Weight:      114 pounds BMI:     20.27 Pulse rate:   65 / minute Resp:     18 per minute BP sitting:   121 / 66  (right arm)  Vitals Entered By: Marrion Coy, CNA (December 24, 2010 9:26 AM)  Physical Exam  General:  patient is stable today in general.  She is a wheelchair. Head:  head is atraumatic. Eyes:  no xanthelasma. Neck:  no jugular venous distention. Chest Wall:  no chest wall tenderness. Lungs:  lungs reveal scattered rhonchi.  There are decreased breath sounds in the left base. Heart:  cardiac exam reveals a regular rhythm.there is an S1-S2.  There is a soft systolic murmur. Abdomen:  abdomen soft. Msk:  no musculoskeletal deformities. Extremities:  no peripheral edema. Skin:  no skin rashes. Psych:  patient is oriented to person time and place.  Affect is normal.   PPM Specifications Following MD:  Lewayne Bunting, MD     PPM Vendor:  Medtronic     PPM Model Number:  [id number]     PPM Serial Number:  [id number] PPM DOI:  11/01/2010     PPM Implanting MD:  Lewayne Bunting, MD  Lead 1    Location: [facility]     DOI: 11/01/2010     Model #: [id number]     Serial #: [id number]     Status: active Lead 2    Location: [facility]     DOI: 11/01/2010     Model #: [id number]     Serial #: [id number]     Status: active  Magnet Response Rate:  BOL 85 ERI 65  Indications:  Sick sinus syndrome   Episodes Coumadin:  Yes  Parameters Mode:  DDI     Lower Rate Limit:  60     Paced AV Delay:  350     Impression & Recommendations:  Problem # 1:   * PULMONARY FUNCTION STUDY ABNORMALITIES The patient's diffusing capacity is severely decreased.  She also has systolic heart failure.  I am stopping amiodarone today.  Her thyroid functions will have to be followed.  Her rhythm will have to be followed.  If she has return of atrial fibrillation she will have to be left in atrial fib with rate control or other medications used.    Problem # 2:  * LEFT PLEURAL EFFUSION The patient  will see Dr.Burney today for followup of her pleural effusion after her tube was removed.  Copy of her recent x-ray will be sent and he will be obtaining an x-ray today also  Problem # 3:  * COUMADIN THERAPY Coumadin was stopped with the patient's recent anemia.  It has not been restarted.  Problem # 4:  * ANEMIA The patient had a colonoscopy since her hospitalization.  No significant abnormalities were found.  She was transfused 3 units in the hospital.  When I saw her I wonder if she had a spontaneous bleed in her right flank.  Problem # 5:  * AMIODARONE THERAPY As noted above I have chosen to stop amiodarone today  Problem # 6:  * HYPOTHYROIDISM She is now on low-dose Synthroid.  With her amiodarone was stopped today her thyroid functions will be followed over time.  Problem # 7:  * CKD  IV Creatinine was up to 2.7.  Most recently is 2.3.  This is not far out of her range.  Her current Demadex dose will be continued.  If she is on additional Lasix this will be stopped.  She'll leave followup kidney function.  Problem # 8:  SYSTOLIC HEART FAILURE, CHRONIC (ICD-428.22) No changes in her basic heart failure meds today.  Problem # 9:  ATRIAL FIBRILLATION (ICD-427.31) The patient's rhythm is regular today.  I'm hopeful that will remain regular with her pacemaker as she is taken off amiodarone.  As part of today's evaluation I have reviewed an extensive amount of information.  I spent greater than one hour reviewing this data and trying to stabilize her overall  status.  Ultimately she will be followed by Dr. Shirlee Latch in the [place] office.  Patient Instructions: 1)  Follow up with Dr. Myrtis Ser on Tuesday, January 21, 2011 at 2:30pm. 2)  Keep appt with Dr. Johney Frame on Thursday, March 27, 2011 at 1:30pm. 3)  Stop Amiodarone. 4)  Stop Lasix. 5)  Continue Demadex.

## 2010-12-31 NOTE — Consult Note (Signed)
Summary: [facility] Report   [facility] Report   Imported By: Roderic Ovens 12/24/2010 14:39:59  _____________________________________________________________________  External Attachment:    Type:   Image     Comment:   External Document

## 2011-01-14 ENCOUNTER — Ambulatory Visit: Payer: Medicare Other | Admitting: Cardiology

## 2011-01-14 ENCOUNTER — Other Ambulatory Visit: Payer: Self-pay | Admitting: Thoracic Surgery

## 2011-01-14 ENCOUNTER — Ambulatory Visit: Payer: Medicare Other | Admitting: Thoracic Surgery

## 2011-01-14 DIAGNOSIS — J90 Pleural effusion, not elsewhere classified: Secondary | ICD-10-CM

## 2011-01-15 ENCOUNTER — Ambulatory Visit (INDEPENDENT_AMBULATORY_CARE_PROVIDER_SITE_OTHER): Payer: Medicare Other | Admitting: Thoracic Surgery

## 2011-01-15 ENCOUNTER — Ambulatory Visit
Admission: RE | Admit: 2011-01-15 | Discharge: 2011-01-15 | Disposition: A | Discharge status: Home / Self Care | Payer: Medicare Other | Source: Ambulatory Visit | Attending: Thoracic Surgery | Admitting: Thoracic Surgery

## 2011-01-15 DIAGNOSIS — J90 Pleural effusion, not elsewhere classified: Secondary | ICD-10-CM

## 2011-01-15 NOTE — Assessment & Plan Note (Signed)
OFFICE VISIT  [name], [name] A DOB:  [birth year]                                        January 15, 2011 CHART #:  [id number]  The patient returns today and her chest x-ray shows her effusion on the left side has resolved.  She has healed well where her Pleurx was.  She is doing well overall.  Her blood pressure is 111/53, pulse 60, respirations 18, sats were 95%.  I will plan to see her back again as needed.  Ines Bloomer, M.D. Electronically Signed  DPB/MEDQ  D:  01/15/2011  T:  01/15/2011  Job:  [id number]

## 2011-01-16 NOTE — Consult Note (Signed)
Summary: Report of Consultation   Report of Consultation   Imported By: Roderic Ovens 01/08/2011 14:23:41  _____________________________________________________________________  External Attachment:    Type:   Image     Comment:   External Document

## 2011-01-16 NOTE — Letter (Signed)
Summary: [facility]   [facility]   Imported By: Roderic Ovens 01/08/2011 10:38:47  _____________________________________________________________________  External Attachment:    Type:   Image     Comment:   External Document

## 2011-01-21 ENCOUNTER — Encounter: Payer: Self-pay | Admitting: Cardiology

## 2011-01-21 ENCOUNTER — Ambulatory Visit (INDEPENDENT_AMBULATORY_CARE_PROVIDER_SITE_OTHER): Payer: Medicare Other | Admitting: Cardiology

## 2011-01-21 ENCOUNTER — Encounter: Payer: Self-pay | Admitting: *Deleted

## 2011-01-21 DIAGNOSIS — Z9229 Personal history of other drug therapy: Secondary | ICD-10-CM

## 2011-01-21 DIAGNOSIS — I2510 Atherosclerotic heart disease of native coronary artery without angina pectoris: Secondary | ICD-10-CM

## 2011-01-21 DIAGNOSIS — E039 Hypothyroidism, unspecified: Secondary | ICD-10-CM | POA: Insufficient documentation

## 2011-01-21 DIAGNOSIS — Z09 Encounter for follow-up examination after completed treatment for conditions other than malignant neoplasm: Secondary | ICD-10-CM

## 2011-01-21 DIAGNOSIS — I10 Essential (primary) hypertension: Secondary | ICD-10-CM

## 2011-01-21 DIAGNOSIS — N184 Chronic kidney disease, stage 4 (severe): Secondary | ICD-10-CM | POA: Insufficient documentation

## 2011-01-21 DIAGNOSIS — D649 Anemia, unspecified: Secondary | ICD-10-CM | POA: Insufficient documentation

## 2011-01-21 DIAGNOSIS — I480 Paroxysmal atrial fibrillation: Secondary | ICD-10-CM

## 2011-01-21 DIAGNOSIS — R942 Abnormal results of pulmonary function studies: Secondary | ICD-10-CM | POA: Insufficient documentation

## 2011-01-21 DIAGNOSIS — J90 Pleural effusion, not elsewhere classified: Secondary | ICD-10-CM

## 2011-01-21 DIAGNOSIS — I255 Ischemic cardiomyopathy: Secondary | ICD-10-CM | POA: Insufficient documentation

## 2011-01-21 DIAGNOSIS — I495 Sick sinus syndrome: Secondary | ICD-10-CM | POA: Insufficient documentation

## 2011-01-21 DIAGNOSIS — I4891 Unspecified atrial fibrillation: Secondary | ICD-10-CM

## 2011-01-21 DIAGNOSIS — Z950 Presence of cardiac pacemaker: Secondary | ICD-10-CM | POA: Insufficient documentation

## 2011-01-21 DIAGNOSIS — I351 Nonrheumatic aortic (valve) insufficiency: Secondary | ICD-10-CM | POA: Insufficient documentation

## 2011-01-21 NOTE — Assessment & Plan Note (Signed)
Coronary disease is stable. No change in therapy. 

## 2011-01-21 NOTE — Progress Notes (Signed)
HPI Patient is seen for followup of atrial fibrillation.  I saw her last December 24, 2010.  At that time it became clear that her pulmonary functions were very abnormal.  On that basis amiodarone was stopped.  She is seeing Dr.  Edwyna Shell in followup and her chest tube has been removed.  She looks good.  So far we have not seen any atrial fibrillation.  Allergies  Allergen Reactions  . Penicillins     Current Outpatient Prescriptions  Medication Sig Dispense Refill  . acetaminophen (TYLENOL) 500 MG tablet Take 1,000 mg by mouth every 6 (six) hours as needed.        . Ascorbic Acid (VITAMIN C) 500 MG tablet Take 500 mg by mouth daily.        Marland Kitchen aspirin 81 MG tablet Take 81 mg by mouth daily.        Marland Kitchen atorvastatin (LIPITOR) 40 MG tablet Take 20 mg by mouth daily.       . calcium acetate (PHOSLO) 667 MG capsule Take 2 tablet by mouth three times a day       . carvedilol (COREG) 12.5 MG tablet Take 12.5 mg by mouth 2 (two) times daily with a meal.        . ferrous sulfate 325 (65 FE) MG tablet Take 325 mg by mouth 3 (three) times daily with meals.        . hydrALAZINE (APRESOLINE) 25 MG tablet Take 25 mg by mouth 3 (three) times daily.        Marland Kitchen HYDROcodone-acetaminophen (NORCO) 5-325 MG per tablet Take 1 tablet by mouth as needed.        . isosorbide dinitrate (ISORDIL) 20 MG tablet Take 20 mg by mouth 3 (three) times daily.        Marland Kitchen levothyroxine (SYNTHROID, LEVOTHROID) 50 MCG tablet Take 50 mcg by mouth daily.        . nitroGLYCERIN (NITROSTAT) 0.4 MG SL tablet Place 0.4 mg under the tongue every 5 (five) minutes as needed.        . polyethylene glycol (MIRALAX / GLYCOLAX) packet Use as directed       . potassium chloride (KLOR-CON) 10 MEQ CR tablet Take 10 mEq by mouth 2 (two) times daily.        Marland Kitchen torsemide (DEMADEX) 20 MG tablet Take 2 tablets by mouth two times a day.          History   Social History  . Marital Status: Widowed    Spouse Name: N/A    Number of Children: N/A  . Years of  Education: N/A   Occupational History  . Not on file.   Social History Main Topics  . Smoking status: Never Smoker   . Smokeless tobacco: Not on file  . Alcohol Use: No  . Drug Use: No  . Sexually Active: Not on file   Other Topics Concern  . Not on file   Social History Narrative  . No narrative on file    Family History  Problem Relation Age of Onset  . Cancer Other   . Coronary artery disease Other     Past Medical History  Diagnosis Date  . Paroxysmal atrial fibrillation     Amiodarone therapy, Coumadin  . History of amiodarone therapy     Stopped December 24, 2010, severe DLCO abnormality  . Warfarin anticoagulation     Held. February, 2012, 3 unit bleed  . CHF (congestive heart failure)  EF 30-35%, echo, December, 2011, presumed ischemic  . Cardiomyopathy     Presumed ischemic  . CAD (coronary artery disease)     Hospitalization December, 2011, suspected prior anterior MI, no heart catheter done (elevated creatinine), echo, December, 2011, apical aneurysm  . Aortic insufficiency     Mild, echo, December, 2011  . Hypertension   . Tachycardia-bradycardia     Pacemaker January, 2012  . Pacemaker     January, 2012, Medtronic, tachybradycardia syndrome or he  . Chronic kidney disease (CKD), stage IV (severe)   . Anemia     Transfused 3 units, February, 2012, ?? component spontaneous right flank bleed, Coumadin held, colonoscopy done later with no significant abnormalities  . Pleural effusion, left     Pleurx catheter February, 2012, eventually removed  . Abnormal PFTs     February, 2012, with moderate restriction, moderate obstruction, severely decreased DLCO  . Hypothyroidism     Diagnosed March, 2012, Synthroid started, may be related to amiodarone    Past Surgical History  Procedure Date  . Pacemaker insertion 11/01/2010    Medtronic Dr. Ladona Ridgel d/t SSS  . Abdominal hysterectomy     ROS  Patient denies fever, chills, headache, sweats, rash, change in  vision, change in hearing, chest pain, cough, nausea vomiting, urinary symptoms.  All other systems are reviewed and are negative. PHYSICAL EXAM Patient is frail but stable.  She is here with a member of the nursing home.  Head is atraumatic.  There is no xanthelasma.  The patient is oriented to person time and place.  Affect is normal.  Lungs are clear.  Respiratory effort is unlabored.  Cardiac exam reveals S1 and S2.  The rhythm is regular.  Abdomen is soft.  There is no significant peripheral edema.  There are no musculoskeletal deformities.  No skin rashes. Filed Vitals:   01/21/11 1522  BP: 124/52  Pulse: 70  Height: 5\' 2"  (1.575 m)  Weight: 111 lb (50.349 kg)    EKG  EKG is not done today.  ASSESSMENT & PLAN

## 2011-01-21 NOTE — Assessment & Plan Note (Signed)
Amiodarone has been stopped.  Will take time for it to fully relieve her system.  Her thyroid will have to be followed also.

## 2011-01-21 NOTE — Assessment & Plan Note (Signed)
The patient is off amiodarone.  I am hopeful that she will hold sinus rhythm over time.

## 2011-01-21 NOTE — Assessment & Plan Note (Signed)
Renal function is significantly abnormal.  He'll have followup labs soon

## 2011-01-21 NOTE — Assessment & Plan Note (Signed)
Blood pressure is under good control. No change in therapy 

## 2011-01-21 NOTE — Assessment & Plan Note (Signed)
Thyroid medication was started while she was on amiodarone.  She needs repeat thyroid functions over time.

## 2011-01-21 NOTE — Assessment & Plan Note (Signed)
Her catheter has been removed from the chest.  Hopefully she'll have no recurrence.

## 2011-01-21 NOTE — Patient Instructions (Addendum)
   CBC/BMET/TSH at Nursing home. Fax to [phone] Your physician wants you to follow-up in: 3 months. You will receive a reminder letter in the mail one-two months in advance. If you don't receive a letter, please call our office to schedule the follow-up appointment. Your physician recommends that you continue on your current medications as directed. Please refer to the Current Medication list given to you today.

## 2011-02-04 ENCOUNTER — Encounter: Payer: Self-pay | Admitting: Cardiology

## 2011-03-27 ENCOUNTER — Ambulatory Visit (INDEPENDENT_AMBULATORY_CARE_PROVIDER_SITE_OTHER): Payer: Medicare Other | Admitting: Internal Medicine

## 2011-03-27 ENCOUNTER — Encounter: Payer: Self-pay | Admitting: Internal Medicine

## 2011-03-27 DIAGNOSIS — I4891 Unspecified atrial fibrillation: Secondary | ICD-10-CM

## 2011-03-27 DIAGNOSIS — I495 Sick sinus syndrome: Secondary | ICD-10-CM

## 2011-03-27 DIAGNOSIS — I428 Other cardiomyopathies: Secondary | ICD-10-CM

## 2011-03-27 DIAGNOSIS — I480 Paroxysmal atrial fibrillation: Secondary | ICD-10-CM

## 2011-03-27 NOTE — Assessment & Plan Note (Signed)
Normal pacemaker function See Pace Art report No changes today  

## 2011-03-27 NOTE — Progress Notes (Signed)
The patient presents today for routine electrophysiology followup.  Since last being seen by Dr Myrtis Ser in our clinic, the patient reports doing very well.  Today, she denies symptoms of palpitations, chest pain, shortness of breath, orthopnea, PND, lower extremity edema, dizziness, presyncope, syncope, or neurologic sequela.  The patient feels that she is tolerating medications without difficulties and is otherwise without complaint today.   Past Medical History  Diagnosis Date  . Paroxysmal atrial fibrillation   . History of amiodarone therapy     Stopped December 24, 2010, severe DLCO abnormality  . Warfarin anticoagulation     Held. February, 2012, 3 unit bleed  . CHF (congestive heart failure)     EF 30-35%, echo, December, 2011, presumed ischemic  . Cardiomyopathy     Presumed ischemic  . CAD (coronary artery disease)     Hospitalization December, 2011, suspected prior anterior MI, no heart catheter done (elevated creatinine), echo, December, 2011, apical aneurysm  . Aortic insufficiency     Mild, echo, December, 2011  . Hypertension   . Tachycardia-bradycardia     Pacemaker January, 2012  . Pacemaker     January, 2012, Medtronic, tachybradycardia syndrome or he  . Chronic kidney disease (CKD), stage IV (severe)   . Anemia     Transfused 3 units, February, 2012, ?? component spontaneous right flank bleed, Coumadin held, colonoscopy done later with no significant abnormalities  . Pleural effusion, left     Pleurx catheter February, 2012, eventually removed  . Abnormal PFTs     February, 2012, with moderate restriction, moderate obstruction, severely decreased DLCO  . Hypothyroidism     Diagnosed March, 2012, Synthroid started, may be related to amiodarone   Past Surgical History  Procedure Date  . Pacemaker insertion 11/01/2010    Medtronic Dr. Ladona Ridgel d/t SSS  . Abdominal hysterectomy     Current Outpatient Prescriptions  Medication Sig Dispense Refill  . Ascorbic Acid (VITAMIN  C) 500 MG tablet Take 500 mg by mouth 2 (two) times daily.       Marland Kitchen atorvastatin (LIPITOR) 40 MG tablet Take 20 mg by mouth daily.       . calcium acetate (PHOSLO) 667 MG capsule Take 2 tablet by mouth three times a day       . carvedilol (COREG) 12.5 MG tablet Take 12.5 mg by mouth 2 (two) times daily with a meal.        . ferrous sulfate 325 (65 FE) MG tablet Take 325 mg by mouth 3 (three) times daily with meals.        . hydrALAZINE (APRESOLINE) 25 MG tablet Take 25 mg by mouth 3 (three) times daily.        . isosorbide mononitrate (ISMO,MONOKET) 20 MG tablet Take 1 tablet by mouth Three times a day.      . levothyroxine (SYNTHROID, LEVOTHROID) 50 MCG tablet Take 50 mcg by mouth daily.        . polyethylene glycol (MIRALAX / GLYCOLAX) packet Use as directed       . potassium chloride (KLOR-CON) 10 MEQ CR tablet Take 10 mEq by mouth 2 (two) times daily.        Marland Kitchen torsemide (DEMADEX) 20 MG tablet Take 2 tablets by mouth two times a day.        Marland Kitchen DISCONTD: acetaminophen (TYLENOL) 500 MG tablet Take 1,000 mg by mouth every 6 (six) hours as needed.        Marland Kitchen DISCONTD: aspirin 81 MG tablet  Take 81 mg by mouth daily.        Marland Kitchen DISCONTD: HYDROcodone-acetaminophen (NORCO) 5-325 MG per tablet Take 1 tablet by mouth as needed.        Marland Kitchen DISCONTD: isosorbide dinitrate (ISORDIL) 20 MG tablet Take 20 mg by mouth 3 (three) times daily.        Marland Kitchen DISCONTD: nitroGLYCERIN (NITROSTAT) 0.4 MG SL tablet Place 0.4 mg under the tongue every 5 (five) minutes as needed.          Allergies  Allergen Reactions  . Penicillins Rash    History   Social History  . Marital Status: Widowed    Spouse Name: N/A    Number of Children: N/A  . Years of Education: N/A   Occupational History  . Not on file.   Social History Main Topics  . Smoking status: Never Smoker   . Smokeless tobacco: Never Used  . Alcohol Use: No  . Drug Use: No  . Sexually Active: Not on file   Other Topics Concern  . Not on file   Social  History Narrative  . No narrative on file    Family History  Problem Relation Age of Onset  . Cancer Other   . Coronary artery disease Other     ROS-  All systems are reviewed and are negative except as outlined in the HPI above    Physical Exam: Filed Vitals:   03/27/11 1323  BP: 112/68  Pulse: 76  Height: 5\' 2"  (1.575 m)  Weight: 107 lb (48.535 kg)    GEN- The patient is elderly appearing, alert and oriented x 3 today.   Head- normocephalic, atraumatic Eyes-  Sclera clear, conjunctiva pink Ears- hearing intact Oropharynx- clear Neck- supple, no JVP Lymph- no cervical lymphadenopathy Lungs- Clear to ausculation bilaterally, normal work of breathing Chest- pacemaker pocket is well healed Heart- Regular rate and rhythm, no murmurs, rubs or gallops, PMI not laterally displaced GI- soft, NT, ND, + BS Extremities- no clubbing, cyanosis, trace edema MS- age appropriate atrophy Skin- no rash or lesion Psych- euthymic mood, full affect Neuro- strength and sensation are intact  Pacemaker interrogation- reviewed in detail today,  See PACEART report  Assessment and Plan:

## 2011-03-27 NOTE — Assessment & Plan Note (Signed)
Presently maintaining sinus rhythm off amiodarone Recent GI bleeding with coumadin.   No changes today Further plans per Dr Myrtis Ser.

## 2011-09-16 IMAGING — CR DG CHEST 1V PORT
1 series · 1 of 1 positions shown · non-contrast
Comparison: Chest 10/31/2010.

CLINICAL DATA: Placement of Pleurex catheter.

PORTABLE CHEST - 1 VIEW

[view not recorded]
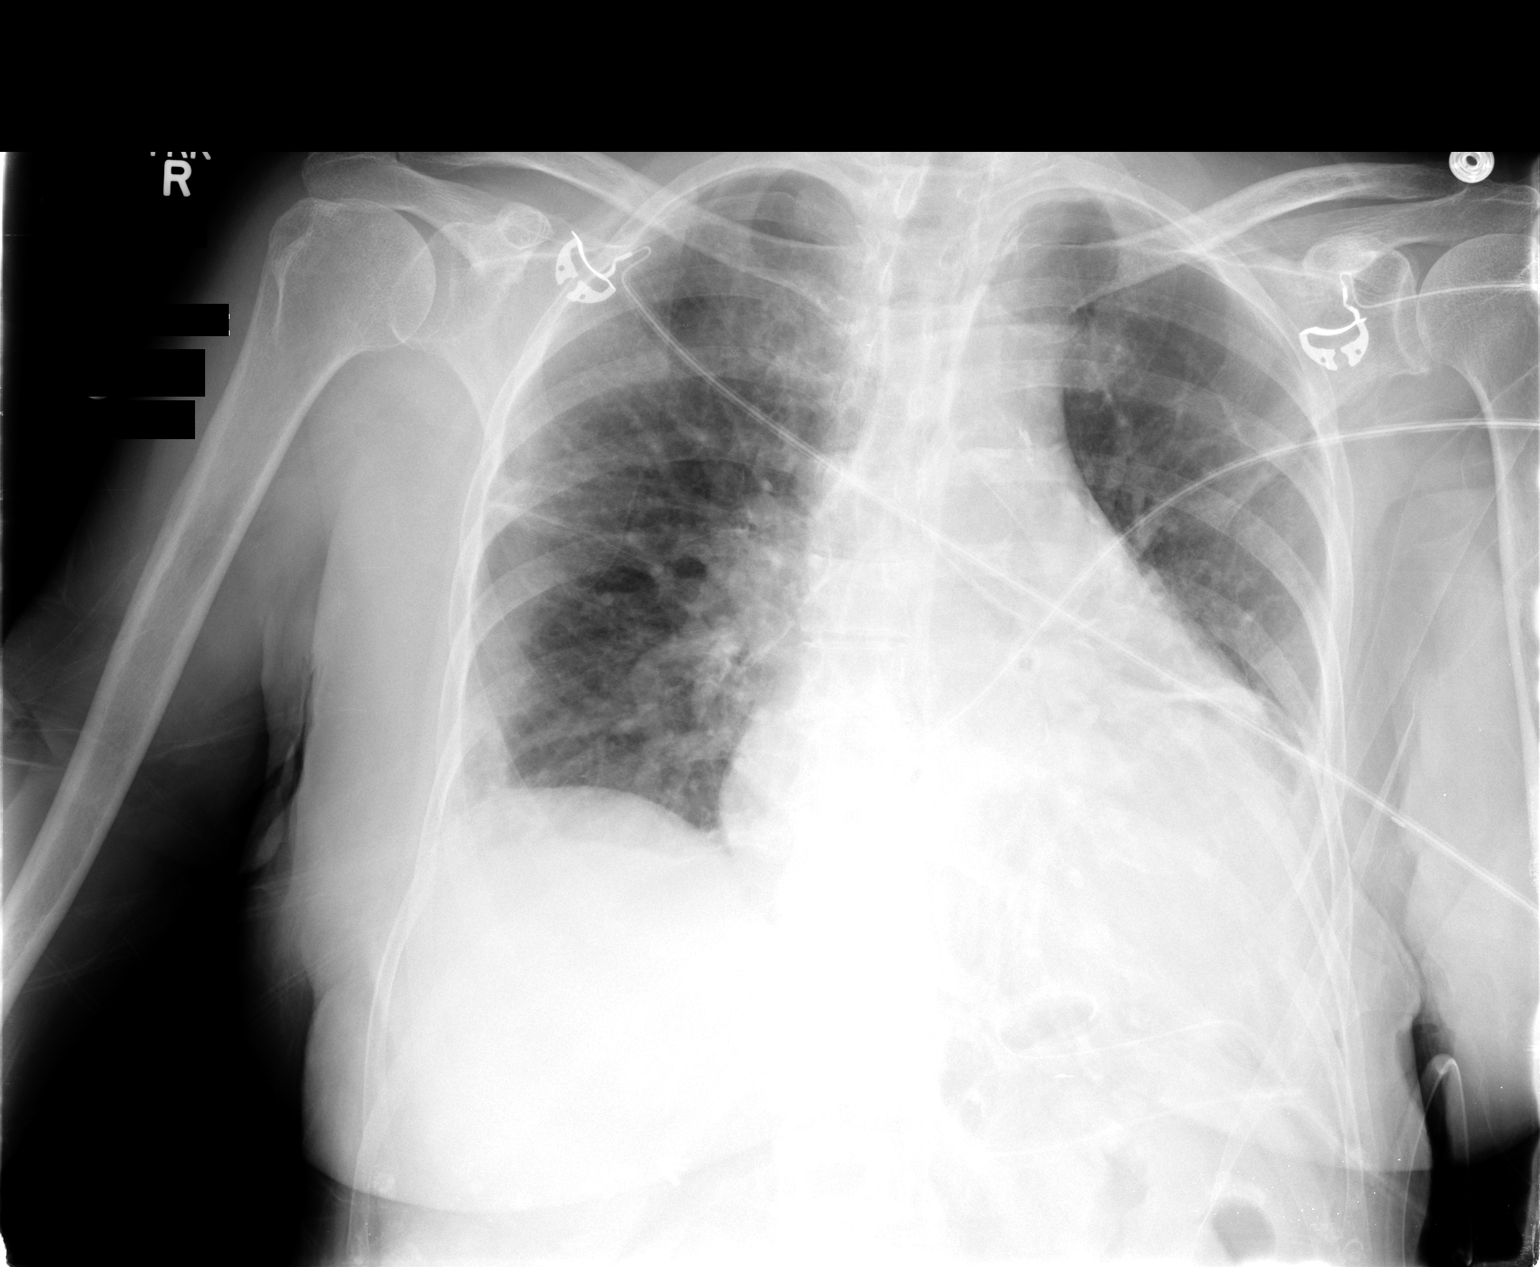

[1 of 1 positions shown; findings below may reference images not displayed]

FINDINGS: New small caliber left chest tube is in place.  Left
pleural effusion is markedly decreased in size.  No pneumothorax
identified.  Mild atelectasis right midlung noted.  Cardiomegaly.
IMPRESSION: Decreased left pleural effusion with a catheter in place.  No
pneumothorax.

## 2011-09-16 IMAGING — CR DG CHEST 2V
2 series · 2 of 2 positions shown · non-contrast
Comparison: 10/28/2010

CLINICAL DATA: Shortness of breath, cough.

CHEST - 2 VIEW

[w chest pa]
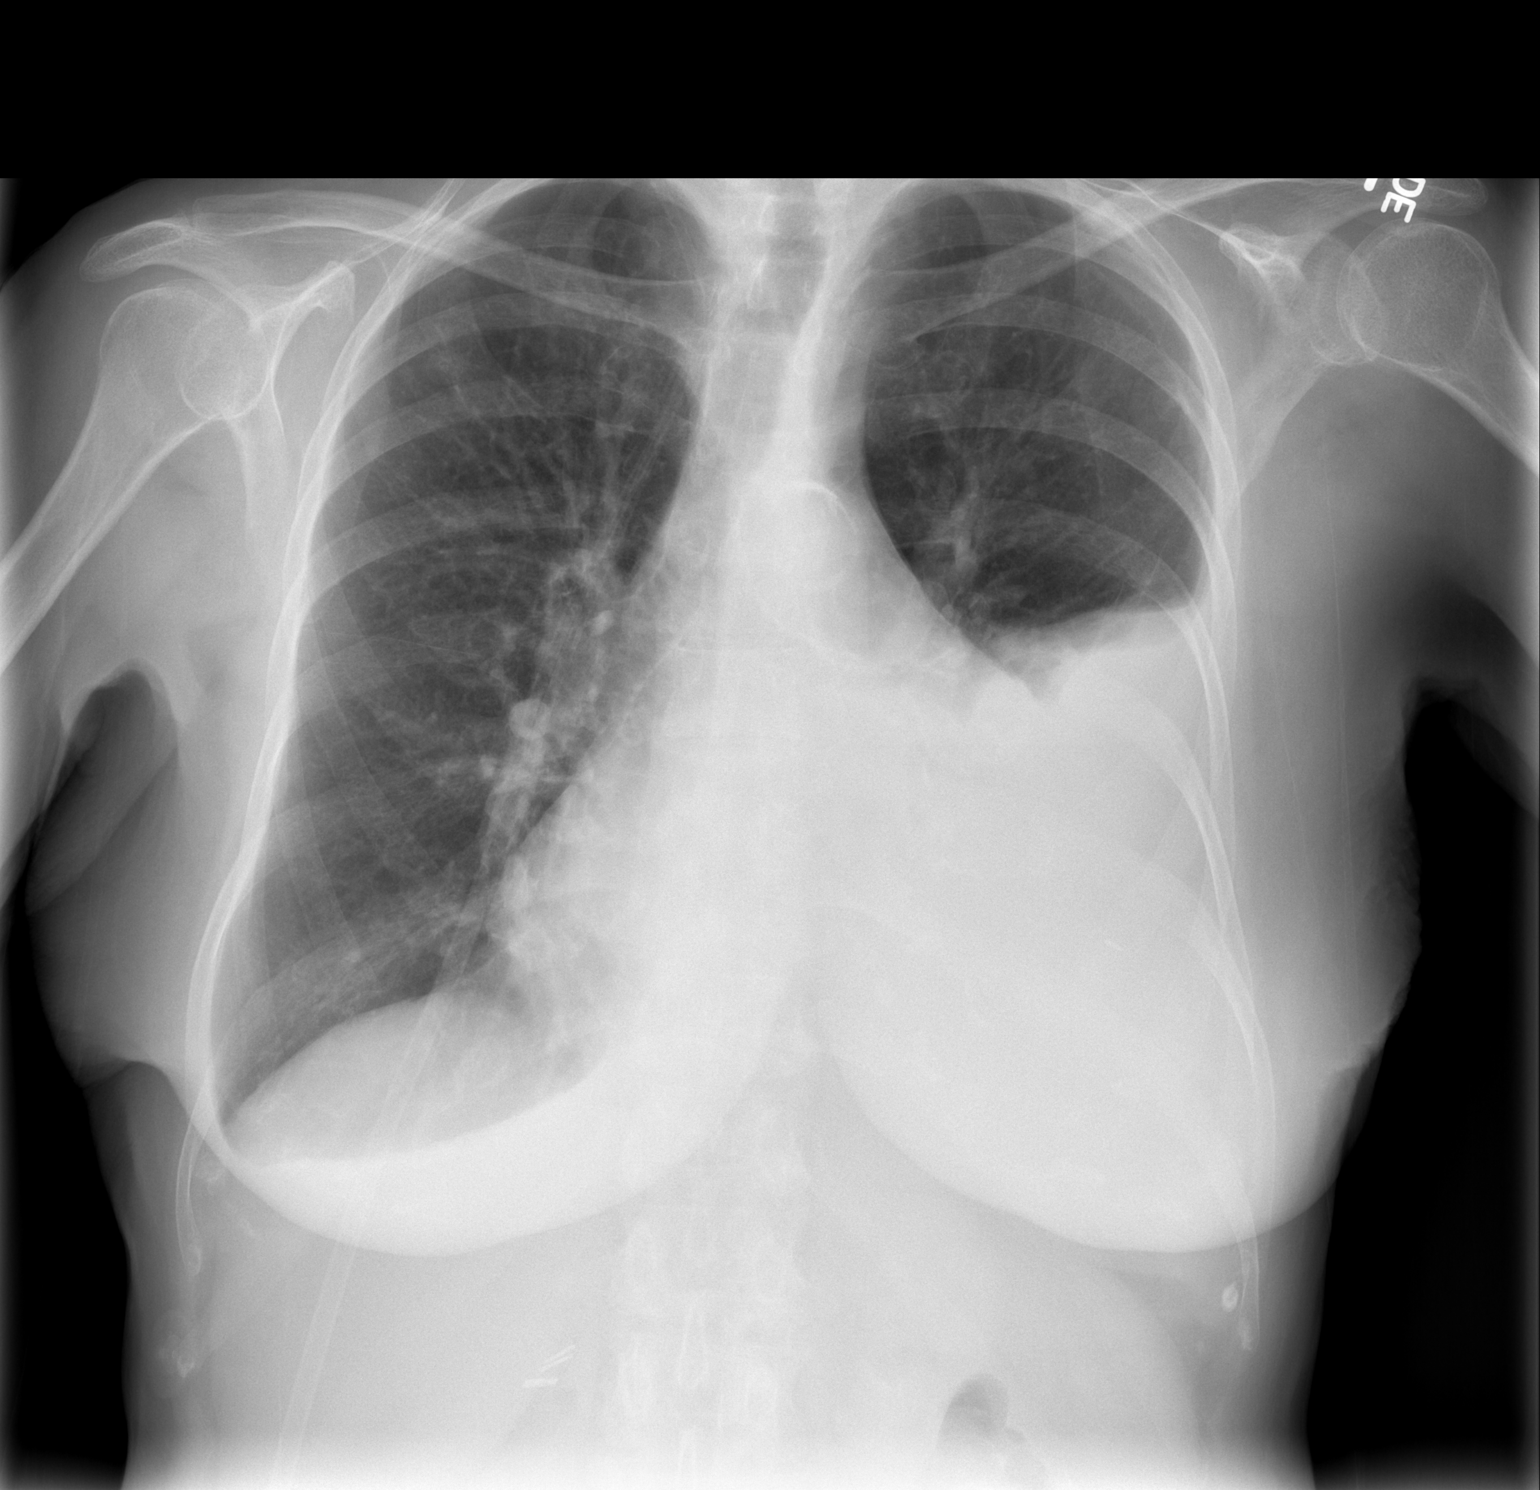

[w chest lat]
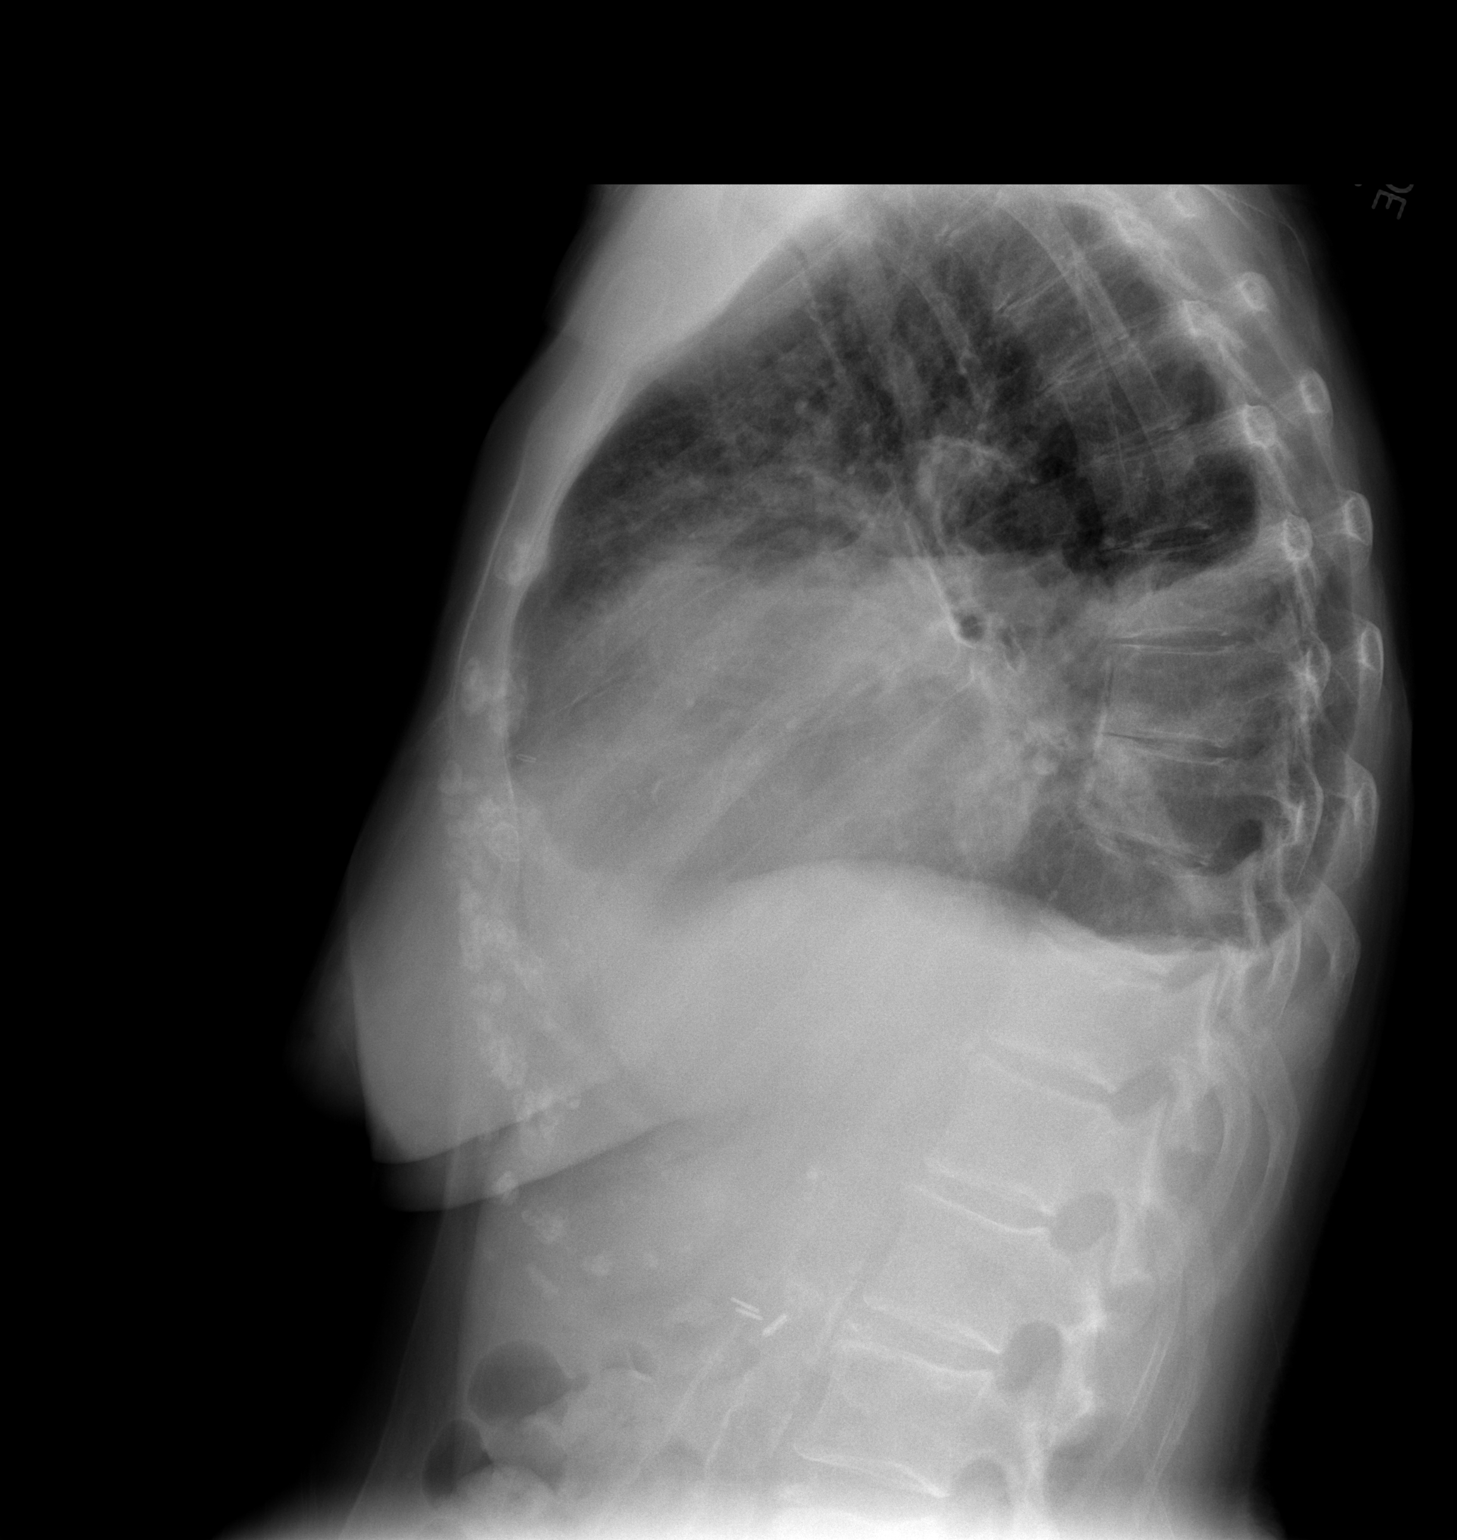

[2 of 2 positions shown; findings below may reference images not displayed]

FINDINGS: Large left pleural effusion again noted, unchanged.  Left
lower lobe atelectasis present.  Small right pleural effusion
present, stable.  There is cardiomegaly.
IMPRESSION: No significant change.  Stable large left effusion and small right
effusion.

## 2011-11-09 IMAGING — CR DG CHEST 2V
2 series · 2 of 2 positions shown · non-contrast
Comparison: 12/03/2010

CLINICAL DATA: Pleural effusion.

CHEST - 2 VIEW

[view not recorded (1 of 2)]
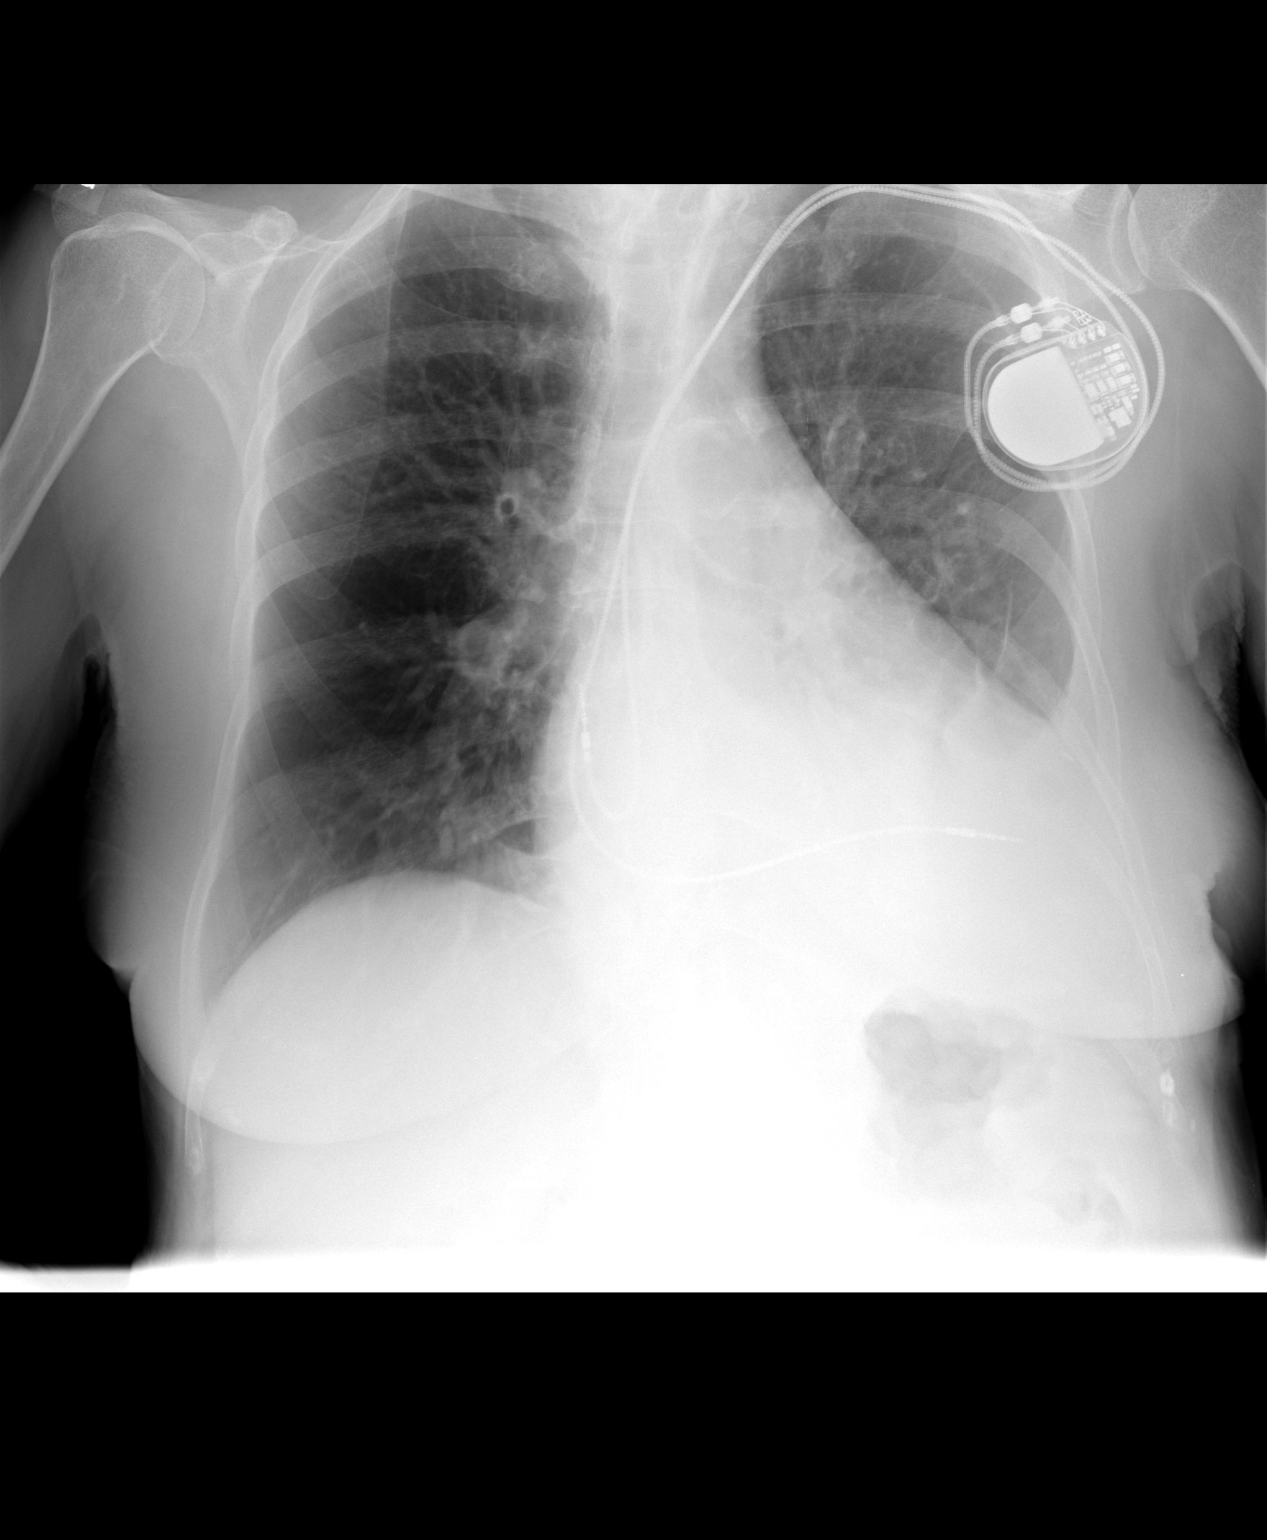

[view not recorded (2 of 2)]
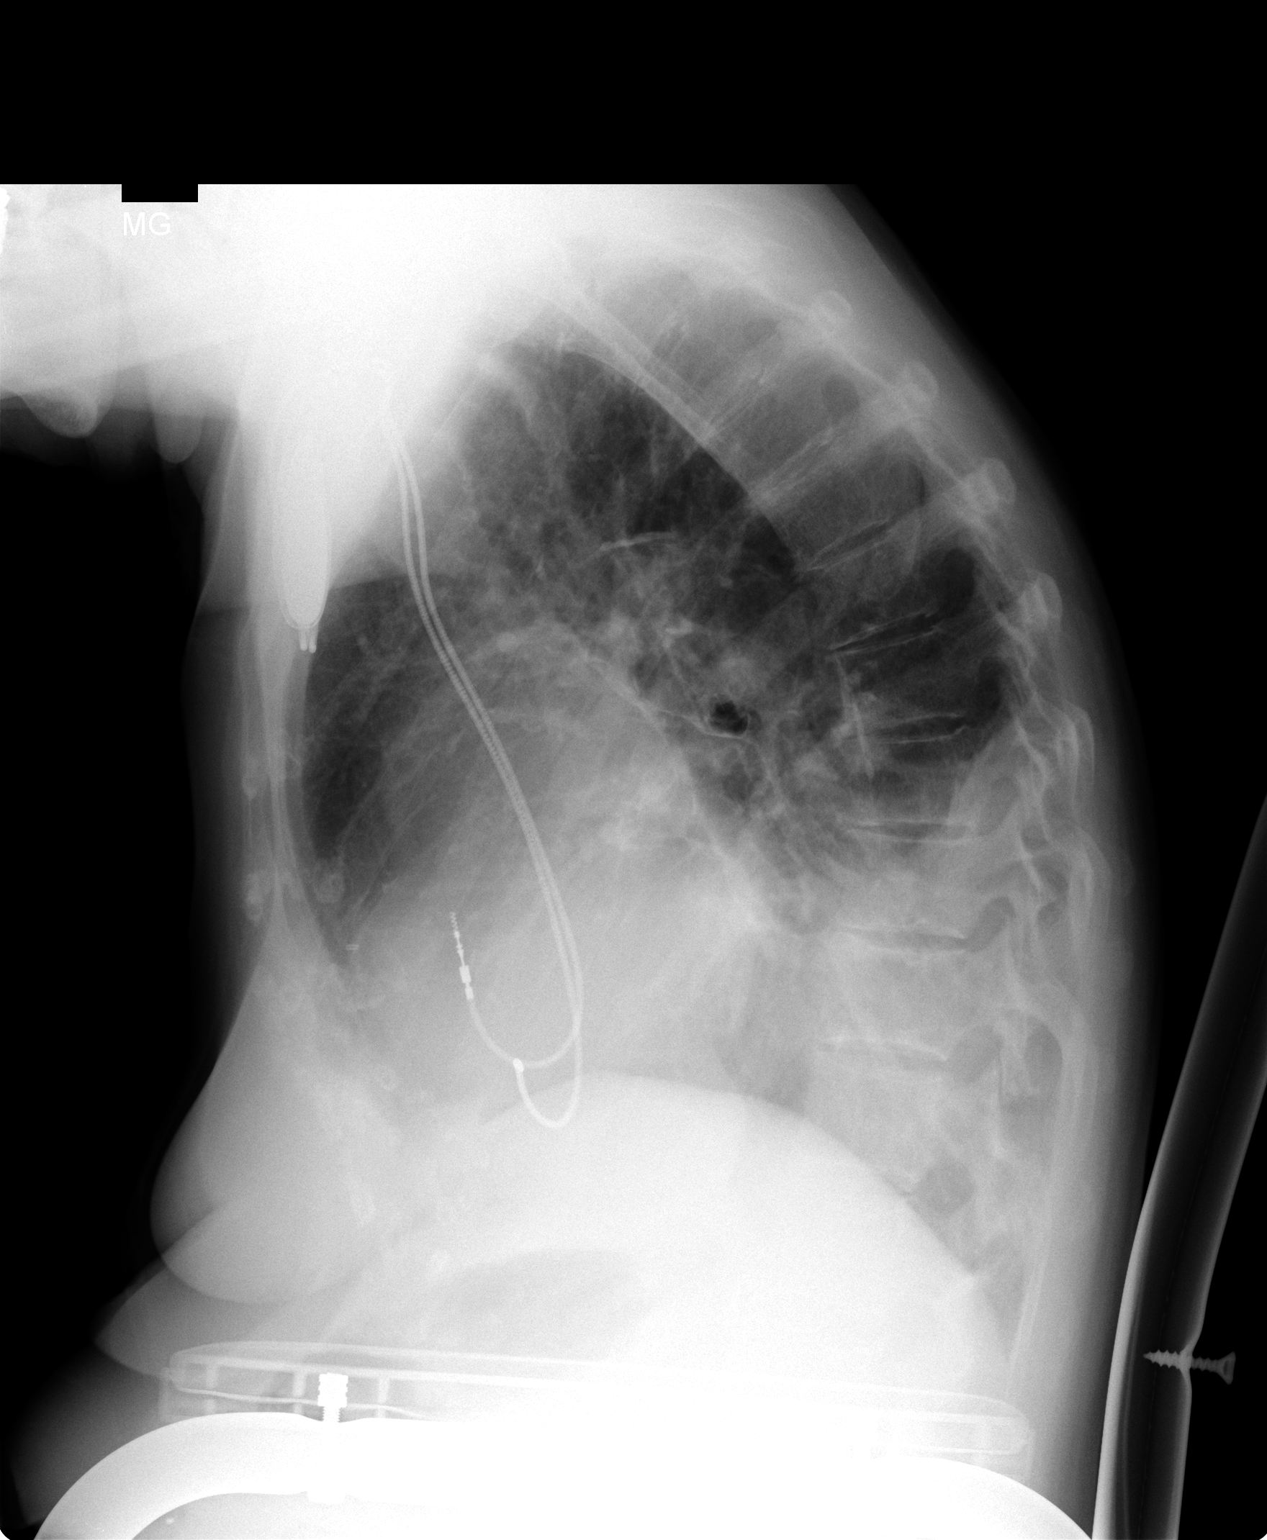

[2 of 2 positions shown; findings below may reference images not displayed]

FINDINGS: Trachea is deviated slightly to the left, which can be
seen with right thyroid enlargement.  Heart size stable.  Pacemaker
lead tips project over the right atrium and right ventricle.  Left
pleural effusion has increased slightly in the interval.  There is
left basilar airspace disease.
IMPRESSION: Interval increase in size of left pleural effusion, now moderate in
size.  Left basilar airspace disease.

## 2011-11-19 ENCOUNTER — Encounter: Payer: Medicare Other | Admitting: Internal Medicine

## 2011-11-28 ENCOUNTER — Encounter: Payer: Medicare Other | Admitting: Internal Medicine

## 2012-02-12 ENCOUNTER — Ambulatory Visit (INDEPENDENT_AMBULATORY_CARE_PROVIDER_SITE_OTHER): Payer: Medicare Other | Admitting: Internal Medicine

## 2012-02-12 ENCOUNTER — Encounter: Payer: Self-pay | Admitting: Internal Medicine

## 2012-02-12 VITALS — BP 120/65 | HR 76 | Ht 62.0 in | Wt 105.0 lb

## 2012-02-12 DIAGNOSIS — I480 Paroxysmal atrial fibrillation: Secondary | ICD-10-CM

## 2012-02-12 DIAGNOSIS — I495 Sick sinus syndrome: Secondary | ICD-10-CM

## 2012-02-12 DIAGNOSIS — I4891 Unspecified atrial fibrillation: Secondary | ICD-10-CM

## 2012-02-12 LAB — PACEMAKER DEVICE OBSERVATION
AL IMPEDENCE PM: 534 Ohm
BATTERY VOLTAGE: 2.79 V
RV LEAD AMPLITUDE: 31.36 mv
VENTRICULAR PACING PM: 0

## 2012-02-12 NOTE — Assessment & Plan Note (Signed)
Normal pacemaker function See Pace Art report No changes today  

## 2012-02-12 NOTE — Assessment & Plan Note (Signed)
No afib since last device interrogation Not felt by Dr Myrtis Ser to be a candidate for anticoagulation No changes today

## 2012-02-12 NOTE — Patient Instructions (Signed)
Your physician you to follow up in 1 year with Dr. Allred and 6 months with Kristin. You will receive a reminder letter in the mail one-two months in advance. If you don't receive a letter, please call our office to schedule the follow-up appointment. Your physician recommends that you continue on your current medications as directed. Please refer to the Current Medication list given to you today. 

## 2012-02-12 NOTE — Progress Notes (Signed)
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 female who presents today for routine electrophysiology followup.  Since last being seen in our clinic, the patient reports doing very well.  Today, she denies symptoms of palpitations, chest pain, shortness of breath,  lower extremity edema, dizziness, presyncope, or syncope.  The patient is otherwise without complaint today.   Past Medical History  Diagnosis Date  . Paroxysmal atrial fibrillation   . History of amiodarone therapy     Stopped December 24, 2010, severe DLCO abnormality  . Warfarin anticoagulation     Held. February, 2012, 3 unit bleed  . CHF (congestive heart failure)     EF 30-35%, echo, December, 2011, presumed ischemic  . Cardiomyopathy     Presumed ischemic  . CAD (coronary artery disease)     Hospitalization December, 2011, suspected prior anterior MI, no heart catheter done (elevated creatinine), echo, December, 2011, apical aneurysm  . Aortic insufficiency     Mild, echo, December, 2011  . Hypertension   . Tachycardia-bradycardia     Pacemaker January, 2012  . Pacemaker     January, 2012, [employer]tr[employer], tachybradycardia syndrome or he  . Chronic kidney disease (CKD), stage IV (severe)   . Anemia     Transfused 3 units, February, 2012, ?? component spontaneous right flank bleed, Coumadin held, colonoscopy done later with no significant abnormalities  . Pleural effusion, left     Pleurx catheter February, 2012, eventually removed  . Abnormal PFTs     February, 2012, with moderate restriction, moderate obstruction, severely decreased DLCO  . Hypothyroidism     Diagnosed March, 2012, Synthroid started, may be related to amiodarone   Past Surgical History  Procedure Date  . Pacemaker insertion 11/01/2010    [employer]tr[employer] Dr. Ladona Ridgel d/t SSS  . Abdominal hysterectomy     Current Outpatient Prescriptions  Medication Sig Dispense Refill  . atorvastatin (LIPITOR) 40 MG tablet Take  20 mg by mouth daily.       . calcium acetate (PHOSLO) 667 MG capsule Take 2 tablet by mouth three times a day       . carvedilol (COREG) 12.5 MG tablet Take 12.5 mg by mouth 2 (two) times daily with a meal.        . ferrous sulfate 325 (65 FE) MG tablet Take 325 mg by mouth 3 (three) times daily with meals.        . hydrALAZINE (APRESOLINE) 25 MG tablet Take 25 mg by mouth 3 (three) times daily.        . isosorbide mononitrate (ISMO,MONOKET) 20 MG tablet Take 1 tablet by mouth Three times a day.      . levothyroxine (SYNTHROID, LEVOTHROID) 50 MCG tablet Take 50 mcg by mouth daily.        . potassium chloride (K-DUR,KLOR-CON) 10 MEQ tablet Take 1 tablet by mouth Twice daily.      Marland Kitchen torsemide (DEMADEX) 20 MG tablet Take 2 tablets by mouth two times a day.          Physical Exam: Filed Vitals:   02/12/12 1020  BP: 120/65  Pulse: 76  Height: 5\' 2"  (1.575 m)  Weight: 105 lb (47.628 kg)    GEN- The patient is well appearing, alert and oriented x 3 today.   Head- normocephalic, atraumatic Eyes-  Sclera clear, conjunctiva pink Ears- hearing intact Oropharynx- clear Lungs- Clear to ausculation bilaterally, normal work of breathing Chest- pacemaker pocket is  well healed Heart- Regular rate and rhythm, no murmurs, rubs or gallops, PMI not laterally displaced GI- soft, NT, ND, + BS Extremities- no clubbing, cyanosis, or edema  Pacemaker interrogation- reviewed in detail today,  See PACEART report  Assessment and Plan:

## 2012-05-08 DIAGNOSIS — R079 Chest pain, unspecified: Secondary | ICD-10-CM

## 2013-02-14 ENCOUNTER — Encounter: Payer: Self-pay | Admitting: Internal Medicine

## 2013-02-14 ENCOUNTER — Ambulatory Visit (INDEPENDENT_AMBULATORY_CARE_PROVIDER_SITE_OTHER): Payer: Medicare Other | Admitting: Internal Medicine

## 2013-02-14 VITALS — BP 133/60 | HR 63 | Wt 97.0 lb

## 2013-02-14 DIAGNOSIS — I10 Essential (primary) hypertension: Secondary | ICD-10-CM

## 2013-02-14 DIAGNOSIS — I4891 Unspecified atrial fibrillation: Secondary | ICD-10-CM

## 2013-02-14 DIAGNOSIS — I480 Paroxysmal atrial fibrillation: Secondary | ICD-10-CM

## 2013-02-14 DIAGNOSIS — I495 Sick sinus syndrome: Secondary | ICD-10-CM

## 2013-02-14 LAB — PACEMAKER DEVICE OBSERVATION
ATRIAL PACING PM: 70
RV LEAD IMPEDENCE PM: 420 Ohm
RV LEAD THRESHOLD: 1.25 V
VENTRICULAR PACING PM: 0

## 2013-02-14 NOTE — Progress Notes (Signed)
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  Since last being seen in our clinic, the patient reports doing very well.  Today, she denies symptoms of palpitations, chest pain, shortness of breath,  lower extremity edema, dizziness, presyncope, or syncope.  The patient is otherwise without complaint today.   Past Medical History  Diagnosis Date  . Paroxysmal atrial fibrillation   . History of amiodarone therapy     Stopped December 24, 2010, severe DLCO abnormality  . Warfarin anticoagulation     Held. February, 2012, 3 unit bleed  . CHF (congestive heart failure)     EF 30-35%, echo, December, 2011, presumed ischemic  . Cardiomyopathy     Presumed ischemic  . CAD (coronary artery disease)     Hospitalization December, 2011, suspected prior anterior MI, no heart catheter done (elevated creatinine), echo, December, 2011, apical aneurysm  . Aortic insufficiency     Mild, echo, December, 2011  . Hypertension   . Tachycardia-bradycardia     Pacemaker January, 2012  . Pacemaker     January, 2012, Medtronic, tachybradycardia syndrome or he  . Chronic kidney disease (CKD), stage IV (severe)   . Anemia     Transfused 3 units, February, 2012, ?? component spontaneous right flank bleed, Coumadin held, colonoscopy done later with no significant abnormalities  . Pleural effusion, left     Pleurx catheter February, 2012, eventually removed  . Abnormal PFTs     February, 2012, with moderate restriction, moderate obstruction, severely decreased DLCO  . Hypothyroidism     Diagnosed March, 2012, Synthroid started, may be related to amiodarone   Past Surgical History  Procedure Laterality Date  . Pacemaker insertion  11/01/2010    [employer] Dr. Ladona Ridgel d/t SSS  . Abdominal hysterectomy      Current Outpatient Prescriptions  Medication Sig Dispense Refill  . atorvastatin (LIPITOR) 40 MG  tablet Take 20 mg by mouth daily.       . calcium acetate (PHOSLO) 667 MG capsule Take 2 tablet by mouth three times a day       . carvedilol (COREG) 12.5 MG tablet Take 12.5 mg by mouth 2 (two) times daily with a meal.        . ferrous sulfate 325 (65 FE) MG tablet Take 325 mg by mouth 3 (three) times daily with meals.        . hydrALAZINE (APRESOLINE) 25 MG tablet Take 25 mg by mouth 3 (three) times daily.        . isosorbide mononitrate (ISMO,MONOKET) 20 MG tablet Take 1 tablet by mouth Three times a day.      . levothyroxine (SYNTHROID, LEVOTHROID) 50 MCG tablet Take 50 mcg by mouth daily.        . potassium chloride (K-DUR,KLOR-CON) 10 MEQ tablet Take 1 tablet by mouth Twice daily.      Marland Kitchen torsemide (DEMADEX) 20 MG tablet Take 2 tablets by mouth two times a day.         No current facility-administered medications for this visit.    Physical Exam: Filed Vitals:   02/14/13 1545  BP: 133/60  Pulse: 63  Weight: 97 lb (43.999 kg)    GEN- The patient is well appearing, alert and oriented x 3 today.   Head- normocephalic, atraumatic Eyes-  Sclera clear, conjunctiva pink Ears- hearing intact Oropharynx- clear Lungs- Clear to ausculation bilaterally, normal work of  breathing Chest- pacemaker pocket is well healed Heart- Regular rate and rhythm, no murmurs, rubs or gallops, PMI not laterally displaced GI- soft, NT, ND, + BS Extremities- no clubbing, cyanosis, or edema  Pacemaker interrogation- reviewed in detail today,  See PACEART report  Assessment and Plan:  1. Bradycardia- Normal pacemaker function See Pace Art report No changes today  2. afib Maintaining sinus rhythm off of AAD Previous GI bleeding with coumadin Not felt to be a candidate for anticoagulation previously No changes today  3. HTN Stable No change required today  Return to the device clinic in 6 months I will see again in 1 year

## 2013-02-14 NOTE — Patient Instructions (Signed)
Continue all current medications. 6 months - device 1 year - Allred

## 2013-08-15 ENCOUNTER — Ambulatory Visit (INDEPENDENT_AMBULATORY_CARE_PROVIDER_SITE_OTHER): Payer: Medicare Other | Admitting: Cardiology

## 2013-08-15 ENCOUNTER — Encounter: Payer: Self-pay | Admitting: Cardiology

## 2013-08-15 VITALS — BP 132/67 | HR 73 | Ht 62.0 in | Wt 96.1 lb

## 2013-08-15 DIAGNOSIS — I2510 Atherosclerotic heart disease of native coronary artery without angina pectoris: Secondary | ICD-10-CM

## 2013-08-15 DIAGNOSIS — I4891 Unspecified atrial fibrillation: Secondary | ICD-10-CM

## 2013-08-15 DIAGNOSIS — I5022 Chronic systolic (congestive) heart failure: Secondary | ICD-10-CM

## 2013-08-15 DIAGNOSIS — I495 Sick sinus syndrome: Secondary | ICD-10-CM

## 2013-08-15 DIAGNOSIS — I480 Paroxysmal atrial fibrillation: Secondary | ICD-10-CM

## 2013-08-15 DIAGNOSIS — J90 Pleural effusion, not elsewhere classified: Secondary | ICD-10-CM

## 2013-08-15 DIAGNOSIS — Z950 Presence of cardiac pacemaker: Secondary | ICD-10-CM

## 2013-08-15 DIAGNOSIS — I10 Essential (primary) hypertension: Secondary | ICD-10-CM

## 2013-08-15 DIAGNOSIS — I779 Disorder of arteries and arterioles, unspecified: Secondary | ICD-10-CM

## 2013-08-15 NOTE — Assessment & Plan Note (Signed)
Patient is presumed to have coronary disease. Catheterization is not been done. There are no symptoms. No further workup.  As part of today's evaluation I spent greater than 25 minutes with her total care. More than half of this time has been with direct contact with the patient and her granddaughter reviewing her long list of issues and discussing her current situation.

## 2013-08-15 NOTE — Patient Instructions (Signed)

## 2013-08-15 NOTE — Assessment & Plan Note (Signed)
Her cardiomyopathy is presumed ischemic. No further workup or change in medicines.

## 2013-08-15 NOTE — Assessment & Plan Note (Signed)
Patient had recurring pleural effusion in the past. Eventually this was removed and treated.

## 2013-08-15 NOTE — Assessment & Plan Note (Signed)
Her pacemaker is stable and place.

## 2013-08-15 NOTE — Assessment & Plan Note (Signed)
The patient has chronic systolic heart failure that is completely stable. No change in therapy.

## 2013-08-15 NOTE — Assessment & Plan Note (Signed)
Historically she had atrial fibrillation. At one point amiodarone was used and then stopped because of pulmonary disease. She hold sinus rhythm. She is not anticoagulated. She's had significant GI bleeding in the past. Fortunately she appears to hold sinus rhythm.

## 2013-08-15 NOTE — Progress Notes (Signed)
HPI  Patient is seen today to followup atrial fibrillation and coronary artery disease. She is doing well. I saw her last April, 2012. I have updated this electronic medical record concerning her general cardiology care. She has been seen by Dr. Johney Frame for pacemaker checks in 2013 and 2014. She's had sinus rhythm. Her pacemaker works well. We had used amiodarone in the past for her rhythm. It was stopped because of poor lung function. Fortunately she is held sinus rhythm. She lives alone.   Allergies  Allergen Reactions  . Penicillins Rash    Current Outpatient Prescriptions  Medication Sig Dispense Refill  . atorvastatin (LIPITOR) 40 MG tablet Take 20 mg by mouth daily.       . calcium acetate (PHOSLO) 667 MG capsule Take 2 tablet by mouth three times a day       . carvedilol (COREG) 12.5 MG tablet Take 12.5 mg by mouth 2 (two) times daily with a meal.        . ferrous sulfate 325 (65 FE) MG tablet Take 325 mg by mouth 3 (three) times daily with meals.        . hydrALAZINE (APRESOLINE) 25 MG tablet Take 25 mg by mouth 3 (three) times daily.        . isosorbide mononitrate (ISMO,MONOKET) 20 MG tablet Take 1 tablet by mouth Three times a day.      . levothyroxine (SYNTHROID, LEVOTHROID) 50 MCG tablet Take 50 mcg by mouth daily.        . potassium chloride (K-DUR,KLOR-CON) 10 MEQ tablet Take 1 tablet by mouth Twice daily.      Marland Kitchen torsemide (DEMADEX) 20 MG tablet Take 2 tablets by mouth two times a day.         No current facility-administered medications for this visit.    History   Social History  . Marital Status: Widowed    Spouse Name: N/A    Number of Children: N/A  . Years of Education: N/A   Occupational History  . Not on file.   Social History Main Topics  . Smoking status: Never Smoker   . Smokeless tobacco: Never Used  . Alcohol Use: No  . Drug Use: No  . Sexual Activity: Not on file   Other Topics Concern  . Not on file   Social History Narrative  . No  narrative on file    Family History  Problem Relation Age of Onset  . Cancer Other   . Coronary artery disease Other     Past Medical History  Diagnosis Date  . Paroxysmal atrial fibrillation   . History of amiodarone therapy     Stopped December 24, 2010, severe DLCO abnormality  . Warfarin anticoagulation     Held. February, 2012, 3 unit bleed  . CHF (congestive heart failure)     EF 30-35%, echo, December, 2011, presumed ischemic  . Cardiomyopathy     Presumed ischemic  . CAD (coronary artery disease)     Hospitalization December, 2011, suspected prior anterior MI, no heart catheter done (elevated creatinine), echo, December, 2011, apical aneurysm  . Aortic insufficiency     Mild, echo, December, 2011  . Hypertension   . Tachycardia-bradycardia     Pacemaker January, 2012  . Pacemaker     January, 2012, [employer]tr[employer], tachybradycardia syndrome or he  . Chronic kidney disease (CKD), stage IV (severe)   . Anemia     Transfused 3 units, February, 2012, ?? component spontaneous right  flank bleed, Coumadin held, colonoscopy done later with no significant abnormalities  . Pleural effusion, left     Pleurx catheter February, 2012, eventually removed  . Abnormal PFTs     February, 2012, with moderate restriction, moderate obstruction, severely decreased DLCO  . Hypothyroidism     Diagnosed March, 2012, Synthroid started, may be related to amiodarone  . Carotid artery disease     Past Surgical History  Procedure Laterality Date  . Pacemaker insertion  11/01/2010    [employer] Dr. Ladona Ridgel d/t SSS  . Abdominal hysterectomy      Patient Active Problem List   Diagnosis Date Noted  . Carotid artery disease   . Paroxysmal atrial fibrillation   . History of amiodarone therapy   . Cardiomyopathy   . CAD (coronary artery disease)   . Aortic insufficiency   . Hypertension   . Tachycardia-bradycardia   . Pacemaker   . Chronic kidney disease (CKD), stage IV (severe)   . Anemia   .  Pleural effusion, left   . Abnormal PFTs   . Hypothyroidism   . TRIFASICULAR BLOCK 12/24/2010  . OTHER DYSPNEA AND RESPIRATORY ABNORMALITIES 12/18/2010  . ATRIAL FIBRILLATION 11/18/2010  . SYSTOLIC HEART FAILURE, CHRONIC 11/18/2010    ROS   patient denies fever, chills, headache, sweats, rash, change in vision, change in hearing, chest pain, cough, nausea vomiting, urinary symptoms. All other systems are reviewed and are negative.  PHYSICAL EXAM  patient is oriented to person time and place. Affect is normal. There is no jugulovenous distention. Lungs are clear. Respiratory effort is not labored. Cardiac exam reveals S1 and S2. There no clicks or significant murmurs. There is a left carotid bruit. The abdomen is soft. There is no peripheral edema. There no musculoskeletal deformities.  Filed Vitals:   08/15/13 1422  BP: 132/67  Pulse: 73  Height: 5\' 2"  (1.575 m)  Weight: 96 lb 1.9 oz (43.6 kg)    EKG is done today and reviewed by me. There is a pacemaker. She is paced atrially with sinus rhythm. Her QRS has not changed.  ASSESSMENT & PLAN

## 2013-08-15 NOTE — Assessment & Plan Note (Signed)
She is not having any symptomatic tachycardia arrhythmias. Her pacemaker protects from bradycardia arrhythmias.

## 2013-08-15 NOTE — Assessment & Plan Note (Signed)
Blood pressure is controlled. No change in therapy. 

## 2013-08-15 NOTE — Assessment & Plan Note (Signed)
The patient has a carotid bruit. However we know from Dopplers from last year that she has calcified vessels but no hemodynamically significant lesions. No further workup.

## 2014-03-01 ENCOUNTER — Encounter: Payer: Self-pay | Admitting: *Deleted

## 2014-05-04 ENCOUNTER — Encounter: Payer: Medicare Other | Admitting: Internal Medicine

## 2014-05-09 ENCOUNTER — Ambulatory Visit (INDEPENDENT_AMBULATORY_CARE_PROVIDER_SITE_OTHER): Payer: Medicare Other | Admitting: Internal Medicine

## 2014-05-09 ENCOUNTER — Encounter: Payer: Self-pay | Admitting: Internal Medicine

## 2014-05-09 VITALS — BP 159/69 | HR 67 | Ht 63.0 in | Wt 100.0 lb

## 2014-05-09 DIAGNOSIS — I495 Sick sinus syndrome: Secondary | ICD-10-CM

## 2014-05-09 DIAGNOSIS — I4891 Unspecified atrial fibrillation: Secondary | ICD-10-CM

## 2014-05-09 LAB — MDC_IDC_ENUM_SESS_TYPE_INCLINIC
Battery Impedance: 349 Ohm
Battery Remaining Longevity: 94 mo
Brady Statistic AP VP Percent: 1 %
Brady Statistic AP VS Percent: 89 %
Brady Statistic AS VP Percent: 0 %
Brady Statistic AS VS Percent: 10 %
Lead Channel Impedance Value: 400 Ohm
Lead Channel Impedance Value: 481 Ohm
Lead Channel Pacing Threshold Amplitude: 0.5 V
Lead Channel Pacing Threshold Pulse Width: 0.4 ms
Lead Channel Sensing Intrinsic Amplitude: 22.4 mV
Lead Channel Sensing Intrinsic Amplitude: 4 mV
Lead Channel Setting Pacing Amplitude: 2 V
Lead Channel Setting Pacing Pulse Width: 0.4 ms
Lead Channel Setting Sensing Sensitivity: 5.6 mV
MDC IDC MSMT BATTERY VOLTAGE: 2.77 V
MDC IDC MSMT LEADCHNL RA PACING THRESHOLD PULSEWIDTH: 0.4 ms
MDC IDC MSMT LEADCHNL RV PACING THRESHOLD AMPLITUDE: 1 V
MDC IDC SESS DTM: 20150721105426
MDC IDC SET LEADCHNL RV PACING AMPLITUDE: 2.5 V

## 2014-05-09 NOTE — Patient Instructions (Signed)
Your physician recommends that you schedule a follow-up appointment in: 6 months with Kristen in the Device Clinic at the office. You will receive a reminder letter in the mail in about 4 months reminding you to call and schedule your appointment. If you don't receive this letter, please contact our office. Your physician recommends that you schedule a follow-up appointment in: 1 year with Dr. Johney FrameAllred. You will receive a reminder letter in the mail in about 10 months reminding you to call and schedule your appointment. If you don't receive this letter, please contact our office. Your physician recommends that you continue on your current medications as directed. Please refer to the Current Medication list given to you today.

## 2014-05-09 NOTE — Progress Notes (Signed)
PCP: Toma DeitersHASANAJ,XAJE A, MD Primary Cardiologist:  Dr Krista BlueKatz  [name] is a 80 y.o. female who presents today for routine electrophysiology followup.  Since last being seen in our clinic, the patient reports doing very well.  Today, she denies symptoms of palpitations, chest pain, shortness of breath,  lower extremity edema, dizziness, presyncope, or syncope.  The patient is otherwise without complaint today.   Past Medical History  Diagnosis Date  . Paroxysmal atrial fibrillation   . History of amiodarone therapy     Stopped December 24, 2010, severe DLCO abnormality  . Warfarin anticoagulation     Held. February, 2012, 3 unit bleed  . CHF (congestive heart failure)     EF 30-35%, echo, December, 2011, presumed ischemic  . Cardiomyopathy     Presumed ischemic  . CAD (coronary artery disease)     Hospitalization December, 2011, suspected prior anterior MI, no heart catheter done (elevated creatinine), echo, December, 2011, apical aneurysm  . Aortic insufficiency     Mild, echo, December, 2011  . Hypertension   . Tachycardia-bradycardia     Pacemaker January, 2012  . Pacemaker     January, 2012, [employer]tr[employer], tachybradycardia syndrome or he  . Chronic kidney disease (CKD), stage IV (severe)   . Anemia     Transfused 3 units, February, 2012, ?? component spontaneous right flank bleed, Coumadin held, colonoscopy done later with no significant abnormalities  . Pleural effusion, left     Pleurx catheter February, 2012, eventually removed  . Abnormal PFTs     February, 2012, with moderate restriction, moderate obstruction, severely decreased DLCO  . Hypothyroidism     Diagnosed March, 2012, Synthroid started, may be related to amiodarone  . Carotid artery disease    Past Surgical History  Procedure Laterality Date  . Pacemaker insertion  11/01/2010    [employer] Dr. Ladona Ridgelaylor d/t SSS  . Abdominal hysterectomy      Current Outpatient Prescriptions  Medication Sig Dispense Refill  .  ALPRAZolam (XANAX) 0.5 MG tablet Take 0.5 mg by mouth at bedtime as needed for anxiety.      Marland Kitchen. aspirin 81 MG tablet Take 81 mg by mouth daily.      . calcium acetate (PHOSLO) 667 MG capsule Take 2 tablet by mouth three times a day       . carvedilol (COREG) 6.25 MG tablet Take 6.25 mg by mouth 2 (two) times daily with a meal.      . ferrous sulfate 325 (65 FE) MG tablet Take 325 mg by mouth 2 (two) times daily with a meal.       . furosemide (LASIX) 40 MG tablet Take 1 tablet by mouth 2 (two) times daily.       . hydrALAZINE (APRESOLINE) 25 MG tablet Take 25 mg by mouth 2 (two) times daily.       . isosorbide dinitrate (ISORDIL) 30 MG tablet Take 30 mg by mouth daily.      Marland Kitchen. levothyroxine (SYNTHROID, LEVOTHROID) 75 MCG tablet Take 75 mcg by mouth daily before breakfast.      . NON FORMULARY OYSTER SHELL 1 TAB PO TID      . NON FORMULARY HALDOL 5 MG /ML PRN      . omeprazole (PRILOSEC) 20 MG capsule Take 20 mg by mouth as needed.      . potassium chloride SA (K-DUR,KLOR-CON) 20 MEQ tablet Take 20 mEq by mouth once.      . simvastatin (ZOCOR) 10 MG tablet  Take 1 tablet by mouth daily.       No current facility-administered medications for this visit.    Physical Exam: Filed Vitals:   05/09/14 1036  BP: 159/69  Pulse: 67  Height: 5\' 3"  (1.6 m)  Weight: 100 lb (45.36 kg)    GEN- The patient is well appearing, alert and oriented x 3 today.   Head- normocephalic, atraumatic Eyes-  Sclera clear, conjunctiva pink Ears- hearing intact Oropharynx- clear Lungs- Clear to ausculation bilaterally, normal work of breathing Chest- pacemaker pocket is well healed Heart- Regular rate and rhythm, no murmurs, rubs or gallops, PMI not laterally displaced GI- soft, NT, ND, + BS Extremities- no clubbing, cyanosis, or edema  Pacemaker interrogation- reviewed in detail today,  See PACEART report  Assessment and Plan:  1. Sinus Bradycardia Normal pacemaker function See Pace Art  report Reprogrammed from DDIR to DDDR today as she has had no atrial arrhythmias in over 2 years  2. afib Maintaining sinus rhythm off of AAD Previous GI bleeding with coumadin Not felt to be a candidate for anticoagulation previously No changes today No afib in over 2 years per PPM interrogation  3. HTN Stable No change required today  Return to the device clinic in 6 months, will try to coordinate with her visit with Dr Myrtis Ser I will see again in 1 year

## 2014-08-22 ENCOUNTER — Ambulatory Visit: Payer: Medicare Other | Admitting: Cardiology

## 2014-09-03 ENCOUNTER — Encounter: Payer: Self-pay | Admitting: Cardiology

## 2014-09-03 DIAGNOSIS — I340 Nonrheumatic mitral (valve) insufficiency: Secondary | ICD-10-CM | POA: Insufficient documentation

## 2014-09-03 DIAGNOSIS — I272 Pulmonary hypertension, unspecified: Secondary | ICD-10-CM | POA: Insufficient documentation

## 2014-09-03 DIAGNOSIS — I5042 Chronic combined systolic (congestive) and diastolic (congestive) heart failure: Secondary | ICD-10-CM | POA: Insufficient documentation

## 2014-09-03 DIAGNOSIS — R9430 Abnormal result of cardiovascular function study, unspecified: Secondary | ICD-10-CM | POA: Insufficient documentation

## 2014-09-04 ENCOUNTER — Encounter: Payer: Self-pay | Admitting: Cardiology

## 2014-09-04 ENCOUNTER — Ambulatory Visit (INDEPENDENT_AMBULATORY_CARE_PROVIDER_SITE_OTHER): Payer: Medicare Other | Admitting: Cardiology

## 2014-09-04 VITALS — BP 147/60 | HR 69 | Ht 63.0 in | Wt 88.0 lb

## 2014-09-04 DIAGNOSIS — Z950 Presence of cardiac pacemaker: Secondary | ICD-10-CM

## 2014-09-04 DIAGNOSIS — I255 Ischemic cardiomyopathy: Secondary | ICD-10-CM

## 2014-09-04 DIAGNOSIS — I480 Paroxysmal atrial fibrillation: Secondary | ICD-10-CM

## 2014-09-04 DIAGNOSIS — I2510 Atherosclerotic heart disease of native coronary artery without angina pectoris: Secondary | ICD-10-CM

## 2014-09-04 DIAGNOSIS — I5042 Chronic combined systolic (congestive) and diastolic (congestive) heart failure: Secondary | ICD-10-CM

## 2014-09-04 NOTE — Assessment & Plan Note (Signed)
Her volume status is quite stable. No change in therapy. 

## 2014-09-04 NOTE — Assessment & Plan Note (Signed)
The patient has paroxysmal atrial fibrillation. She has had GI bleeding and cannot be anticoagulated.

## 2014-09-04 NOTE — Patient Instructions (Signed)
Continue all current medications. Your physician wants you to follow up in:  1 year.  You will receive a reminder letter in the mail one-two months in advance.  If you don't receive a letter, please call our office to schedule the follow up appointment   

## 2014-09-04 NOTE — Progress Notes (Signed)
Patient ID: [name], female   DOB: 04/17/1926, 80 y.o.   MRN: [id number]    HPI  patient is seen today to follow-up atrial fib and cardiomyopathy. She has a pacemaker. She sees Dr. Johney FrameAllred . She will be seeing him in January. She's not having any chest pain. There is an echo report in the chart from [facility] from April of 2015. It shows that her ejection fraction is 50%, this is better than we had seen in the past.  Allergies  Allergen Reactions  . Penicillins Rash    Current Outpatient Prescriptions  Medication Sig Dispense Refill  . aspirin 81 MG tablet Take 81 mg by mouth daily.    . calcium acetate (PHOSLO) 667 MG capsule Take 2 tablet by mouth three times a day     . carvedilol (COREG) 6.25 MG tablet Take 6.25 mg by mouth 2 (two) times daily with a meal.    . ferrous sulfate 325 (65 FE) MG tablet Take 325 mg by mouth 2 (two) times daily with a meal.     . furosemide (LASIX) 40 MG tablet Take 1 tablet by mouth 2 (two) times daily.     . hydrALAZINE (APRESOLINE) 25 MG tablet Take 25 mg by mouth 2 (two) times daily.     . isosorbide dinitrate (ISORDIL) 30 MG tablet Take 30 mg by mouth daily.    Marland Kitchen. levothyroxine (SYNTHROID, LEVOTHROID) 75 MCG tablet Take 75 mcg by mouth daily before breakfast.    . NON FORMULARY OYSTER SHELL 1 TAB PO TID    . potassium chloride SA (K-DUR,KLOR-CON) 20 MEQ tablet Take 20 mEq by mouth once.    . simvastatin (ZOCOR) 10 MG tablet Take 1 tablet by mouth daily.    . traMADol (ULTRAM) 50 MG tablet Take 50 mg by mouth 3 (three) times daily as needed.     No current facility-administered medications for this visit.    History   Social History  . Marital Status: Widowed    Spouse Name: N/A    Number of Children: N/A  . Years of Education: N/A   Occupational History  . Not on file.   Social History Main Topics  . Smoking status: Never Smoker   . Smokeless tobacco: Never Used  . Alcohol Use: No  . Drug Use: No  . Sexual Activity: Not  on file   Other Topics Concern  . Not on file   Social History Narrative    Family History  Problem Relation Age of Onset  . Cancer Other   . Coronary artery disease Other     Past Medical History  Diagnosis Date  . Paroxysmal atrial fibrillation   . History of amiodarone therapy     Stopped December 24, 2010, severe DLCO abnormality  . Warfarin anticoagulation     Held. February, 2012, 3 unit bleed  . CHF (congestive heart failure)     EF 30-35%, echo, December, 2011, presumed ischemic  . Cardiomyopathy     Presumed ischemic  . CAD (coronary artery disease)     Hospitalization December, 2011, suspected prior anterior MI, no heart catheter done (elevated creatinine), echo, December, 2011, apical aneurysm  . Aortic insufficiency     Mild, echo, December, 2011  . Hypertension   . Tachycardia-bradycardia     Pacemaker January, 2012  . Pacemaker     January, 2012, Medtronic, tachybradycardia syndrome or he  . Chronic kidney disease (CKD), stage IV (severe)   . Anemia  Transfused 3 units, February, 2012, ?? component spontaneous right flank bleed, Coumadin held, colonoscopy done later with no significant abnormalities  . Pleural effusion, left     Pleurx catheter February, 2012, eventually removed  . Abnormal PFTs     February, 2012, with moderate restriction, moderate obstruction, severely decreased DLCO  . Hypothyroidism     Diagnosed March, 2012, Synthroid started, may be related to amiodarone  . Carotid artery disease   . Ejection fraction     Past Surgical History  Procedure Laterality Date  . Pacemaker insertion  11/01/2010    Medtronic Dr. Ladona Ridgelaylor d/t SSS  . Abdominal hysterectomy      Patient Active Problem List   Diagnosis Date Noted  . Mitral regurgitation 09/03/2014  . Pulmonary hypertension 09/03/2014  . Chronic combined systolic and diastolic CHF (congestive heart failure) 09/03/2014  . Ejection fraction   . Carotid artery disease   . Paroxysmal  atrial fibrillation   . History of amiodarone therapy   . Cardiomyopathy   . CAD (coronary artery disease)   . Aortic insufficiency   . Hypertension   . Tachycardia-bradycardia   . Pacemaker   . Chronic kidney disease (CKD), stage IV (severe)   . Anemia   . Pleural effusion, left   . Abnormal PFTs   . Hypothyroidism   . OTHER DYSPNEA AND RESPIRATORY ABNORMALITIES 12/18/2010    ROS   patient denies fever, chills, headache, sweats, rash, change in vision, change in hearing, chest pain, cough, nausea or vomiting, urinary symptoms. All other systems are reviewed and are negative.  PHYSICAL EXAM  patient is oriented to person time and place. Affect is normal. She is here with a family member. Head is atraumatic. Sclera and conjunctiva are normal. Lungs reveal a few scattered rhonchi. There is no respiratory distress. There is no jugular venous distention. Cardiac exam reveals S1 and S2. The abdomen is soft. There is no peripheral edema. There are no musculoskeletal deformities. There are no skin rashes.  Filed Vitals:   09/04/14 0850  BP: 147/60  Pulse: 69  Height: 5\' 3"  (1.6 m)  Weight: 88 lb (39.917 kg)  SpO2: 98%     ASSESSMENT & PLAN

## 2014-09-04 NOTE — Assessment & Plan Note (Signed)
The patient has presumed coronary disease. She is stable. No further workup.

## 2014-09-04 NOTE — Assessment & Plan Note (Signed)
Her pacemaker is followed carefully by Dr. Johney FrameAllred

## 2014-09-04 NOTE — Assessment & Plan Note (Signed)
The patient's left ventricular function has improved as of an echo in April of this year. No change in therapy.

## 2014-11-08 ENCOUNTER — Ambulatory Visit (INDEPENDENT_AMBULATORY_CARE_PROVIDER_SITE_OTHER): Payer: Medicare Other | Admitting: *Deleted

## 2014-11-08 ENCOUNTER — Encounter: Payer: Self-pay | Admitting: Internal Medicine

## 2014-11-08 DIAGNOSIS — I480 Paroxysmal atrial fibrillation: Secondary | ICD-10-CM

## 2014-11-08 DIAGNOSIS — I495 Sick sinus syndrome: Secondary | ICD-10-CM | POA: Diagnosis not present

## 2014-11-08 LAB — MDC_IDC_ENUM_SESS_TYPE_INCLINIC
Battery Impedance: 424 Ohm
Battery Voltage: 2.78 V
Brady Statistic AP VS Percent: 27 %
Brady Statistic AS VP Percent: 0 %
Brady Statistic AS VS Percent: 73 %
Date Time Interrogation Session: 20160120102356
Lead Channel Impedance Value: 411 Ohm
Lead Channel Pacing Threshold Amplitude: 1 V
Lead Channel Pacing Threshold Pulse Width: 0.4 ms
Lead Channel Sensing Intrinsic Amplitude: 4 mV
Lead Channel Setting Pacing Amplitude: 2.5 V
Lead Channel Setting Pacing Pulse Width: 0.4 ms
Lead Channel Setting Sensing Sensitivity: 5.6 mV
MDC IDC MSMT BATTERY REMAINING LONGEVITY: 97 mo
MDC IDC MSMT LEADCHNL RA IMPEDANCE VALUE: 481 Ohm
MDC IDC MSMT LEADCHNL RA PACING THRESHOLD AMPLITUDE: 0.5 V
MDC IDC MSMT LEADCHNL RA PACING THRESHOLD PULSEWIDTH: 0.4 ms
MDC IDC MSMT LEADCHNL RV SENSING INTR AMPL: 22.4 mV
MDC IDC SET LEADCHNL RA PACING AMPLITUDE: 2 V
MDC IDC STAT BRADY AP VP PERCENT: 0 %

## 2014-11-08 NOTE — Progress Notes (Signed)
Pacemaker check in clinic. Battery longevity 8 years. Normal device function. 3 ventricular high rate episodes--all 6 beats long. No mode switch episodes. ROV 05-25-15 @ 1000 with JA/E[facility].

## 2015-05-25 ENCOUNTER — Other Ambulatory Visit: Payer: Self-pay | Admitting: Internal Medicine

## 2015-05-25 ENCOUNTER — Encounter: Payer: Self-pay | Admitting: Internal Medicine

## 2015-05-25 ENCOUNTER — Ambulatory Visit (INDEPENDENT_AMBULATORY_CARE_PROVIDER_SITE_OTHER): Payer: Medicare Other | Admitting: Internal Medicine

## 2015-05-25 VITALS — BP 109/64 | HR 67 | Ht 63.0 in | Wt 103.0 lb

## 2015-05-25 DIAGNOSIS — I480 Paroxysmal atrial fibrillation: Secondary | ICD-10-CM

## 2015-05-25 DIAGNOSIS — I495 Sick sinus syndrome: Secondary | ICD-10-CM | POA: Diagnosis not present

## 2015-05-25 LAB — CUP PACEART INCLINIC DEVICE CHECK
Battery Impedance: 603 Ohm
Battery Remaining Longevity: 81 mo
Brady Statistic AP VP Percent: 0 %
Brady Statistic AS VP Percent: 0 %
Date Time Interrogation Session: 20160805150629
Lead Channel Pacing Threshold Amplitude: 0.5 V
Lead Channel Pacing Threshold Amplitude: 1 V
Lead Channel Sensing Intrinsic Amplitude: 22.4 mV
Lead Channel Setting Pacing Amplitude: 2.5 V
Lead Channel Setting Pacing Pulse Width: 0.4 ms
MDC IDC MSMT BATTERY VOLTAGE: 2.77 V
MDC IDC MSMT LEADCHNL RA IMPEDANCE VALUE: 481 Ohm
MDC IDC MSMT LEADCHNL RA PACING THRESHOLD PULSEWIDTH: 0.4 ms
MDC IDC MSMT LEADCHNL RA SENSING INTR AMPL: 2.8 mV
MDC IDC MSMT LEADCHNL RV IMPEDANCE VALUE: 408 Ohm
MDC IDC MSMT LEADCHNL RV PACING THRESHOLD PULSEWIDTH: 0.4 ms
MDC IDC SET LEADCHNL RA PACING AMPLITUDE: 2 V
MDC IDC SET LEADCHNL RV SENSING SENSITIVITY: 5.6 mV
MDC IDC STAT BRADY AP VS PERCENT: 37 %
MDC IDC STAT BRADY AS VS PERCENT: 63 %

## 2015-05-25 NOTE — Patient Instructions (Addendum)
Your physician recommends that you continue on your current medications as directed. Please refer to the Current Medication list given to you today. Your physician recommends that you schedule a follow-up appointment in: 6 months in the device clinic. You will receive a reminder letter in the mail in about 4 months reminding you to call and schedule your appointment. If you don't receive this letter, please contact our office. Your physician recommends that you schedule a follow-up appointment in: 1 year with Dr. Allred. You will receive a reminder letter in the mail in about 10 months reminding you to call and schedule your appointment. If you don't receive this letter, please contact our office. 

## 2015-05-25 NOTE — Progress Notes (Signed)
PCP: Toma Deiters, MD Primary Cardiologist:  [name] is a 81 y.o. female who presents today for routine electrophysiology followup.  Since last being seen in our clinic, the patient reports doing very well.  Today, she denies symptoms of palpitations, chest pain, shortness of breath,  lower extremity edema, dizziness, presyncope, or syncope.  The patient is otherwise without complaint today.   Past Medical History  Diagnosis Date  . Paroxysmal atrial fibrillation   . History of amiodarone therapy     Stopped December 24, 2010, severe DLCO abnormality  . Warfarin anticoagulation     Held. February, 2012, 3 unit bleed  . CHF (congestive heart failure)     EF 30-35%, echo, December, 2011, presumed ischemic  . Cardiomyopathy     Presumed ischemic  . CAD (coronary artery disease)     Hospitalization December, 2011, suspected prior anterior MI, no heart catheter done (elevated creatinine), echo, December, 2011, apical aneurysm  . Aortic insufficiency     Mild, echo, December, 2011  . Hypertension   . Tachycardia-bradycardia     Pacemaker January, 2012  . Pacemaker     January, 2012, Medtronic, tachybradycardia syndrome or he  . Chronic kidney disease (CKD), stage IV (severe)   . Anemia     Transfused 3 units, February, 2012, ?? component spontaneous right flank bleed, Coumadin held, colonoscopy done later with no significant abnormalities  . Pleural effusion, left     Pleurx catheter February, 2012, eventually removed  . Abnormal PFTs     February, 2012, with moderate restriction, moderate obstruction, severely decreased DLCO  . Hypothyroidism     Diagnosed March, 2012, Synthroid started, may be related to amiodarone  . Carotid artery disease   . Ejection fraction    Past Surgical History  Procedure Laterality Date  . Pacemaker insertion  11/01/2010    Medtronic Dr. Ladona Ridgel d/t SSS  . Abdominal hysterectomy      Current Outpatient Prescriptions  Medication Sig  Dispense Refill  . aspirin 81 MG tablet Take 81 mg by mouth daily.    . calcium acetate (PHOSLO) 667 MG capsule Take 2 tablet by mouth three times a day     . calcium-vitamin D (OSCAL WITH D) 500-200 MG-UNIT per tablet Take 1 tablet by mouth 3 (three) times daily.    . carvedilol (COREG) 6.25 MG tablet Take 6.25 mg by mouth 2 (two) times daily with a meal.    . ferrous sulfate 325 (65 FE) MG tablet Take 325 mg by mouth 2 (two) times daily with a meal.     . furosemide (LASIX) 40 MG tablet Take 1 tablet by mouth 2 (two) times daily.     . hydrALAZINE (APRESOLINE) 25 MG tablet Take 25 mg by mouth 2 (two) times daily.     . isosorbide dinitrate (ISORDIL) 30 MG tablet Take 30 mg by mouth daily.    Marland Kitchen levothyroxine (SYNTHROID, LEVOTHROID) 75 MCG tablet Take 75 mcg by mouth daily before breakfast.    . NON FORMULARY OYSTER SHELL 1 TAB PO TID    . potassium chloride SA (K-DUR,KLOR-CON) 20 MEQ tablet Take 20 mEq by mouth once.    . simvastatin (ZOCOR) 10 MG tablet Take 1 tablet by mouth daily.     No current facility-administered medications for this visit.    Physical Exam: Filed Vitals:   05/25/15 1027  BP: 109/64  Pulse: 67  Height: 5\' 3"  (1.6 m)  Weight: 46.72 kg (103  lb)  SpO2: 97%    GEN- The patient is well appearing, alert and oriented x 3 today.   Head- normocephalic, atraumatic Eyes-  Sclera clear, conjunctiva pink Ears- hearing intact Oropharynx- clear Lungs- Clear to ausculation bilaterally, normal work of breathing Chest- pacemaker pocket is well healed Heart- Regular rate and rhythm, no murmurs, rubs or gallops, PMI not laterally displaced GI- soft, NT, ND, + BS Extremities- no clubbing, cyanosis, or edema  Pacemaker interrogation- reviewed in detail today,  See PACEART report  Assessment and Plan:  1. Sinus Bradycardia Normal pacemaker function See Pace Art report Reprogrammed from DDIR to DDDR today as she has had no atrial arrhythmias in over 2 years  2.  afib Maintaining sinus rhythm off of AAD Previous GI bleeding with coumadin Not felt to be a candidate for anticoagulation previously No changes today No afib in over 3 years per PPM interrogation  3. HTN Stable No change required today  Return to the device clinic in 6 months I will see again in 1 year

## 2015-09-05 ENCOUNTER — Ambulatory Visit (INDEPENDENT_AMBULATORY_CARE_PROVIDER_SITE_OTHER): Payer: Medicare Other | Admitting: Cardiology

## 2015-09-05 ENCOUNTER — Encounter: Payer: Self-pay | Admitting: Cardiology

## 2015-09-05 VITALS — BP 105/61 | HR 65 | Ht 63.0 in | Wt 102.0 lb

## 2015-09-05 DIAGNOSIS — I5022 Chronic systolic (congestive) heart failure: Secondary | ICD-10-CM

## 2015-09-05 DIAGNOSIS — I495 Sick sinus syndrome: Secondary | ICD-10-CM

## 2015-09-05 DIAGNOSIS — I480 Paroxysmal atrial fibrillation: Secondary | ICD-10-CM | POA: Diagnosis not present

## 2015-09-05 DIAGNOSIS — I2510 Atherosclerotic heart disease of native coronary artery without angina pectoris: Secondary | ICD-10-CM

## 2015-09-05 NOTE — Progress Notes (Signed)
Patient ID: [name], female   DOB: [birth year], 81 y.o.   MRN: [id number]     Clinical Summary [name] is a 81 y.o.female former patient of [name], this is our first visit together. She is seen for the following medical problems.  1. Afib - amio stopped in the past due to severe DLCO abnormality - according to [name] notes she has history of GI bleeding and has not been on anticoagulation. From EP note no afib noted in over 3 years by [facility] interrogation.  - denies any recent palpitations  2. Chronic systolic HF - LVEF as low as 30-35%. Most recently echo 01/2014 50-55%.  - no ACE-I due to renal dysfunction - denies any SOB or DOE. No LE edema   3. CAD - suspected prior anterior MI based on echo WMAs, has not had confirmatory cath due to poor renal function.  - denies any chest pain  4. Tachy-brady syndrome - has pacemaker. Last device check 05/2015 normal function - denies any lightheadness, no dizziness.   5. Hyperlipdiemia - compliant with statin - no recent panel on our system.   Past Medical History  Diagnosis Date  . Paroxysmal atrial fibrillation   . History of amiodarone therapy     Stopped December 24, 2010, severe DLCO abnormality  . Warfarin anticoagulation     Held. February, 2012, 3 unit bleed  . CHF (congestive heart failure)     EF 30-35%, echo, December, 2011, presumed ischemic  . Cardiomyopathy     Presumed ischemic  . CAD (coronary artery disease)     Hospitalization December, 2011, suspected prior anterior MI, no heart catheter done (elevated creatinine), echo, December, 2011, apical aneurysm  . Aortic insufficiency     Mild, echo, December, 2011  . Hypertension   . Tachycardia-bradycardia     Pacemaker January, 2012  . Pacemaker     January, 2012, Medtronic, tachybradycardia syndrome or he  . Chronic kidney disease (CKD), stage IV (severe)   . Anemia     Transfused 3 units, February, 2012, ?? component spontaneous right flank bleed,  Coumadin held, colonoscopy done later with no significant abnormalities  . Pleural effusion, left     Pleurx catheter February, 2012, eventually removed  . Abnormal PFTs     February, 2012, with moderate restriction, moderate obstruction, severely decreased DLCO  . Hypothyroidism     Diagnosed March, 2012, Synthroid started, may be related to amiodarone  . Carotid artery disease   . Ejection fraction      Allergies  Allergen Reactions  . Penicillins Rash     Current Outpatient Prescriptions  Medication Sig Dispense Refill  . aspirin 81 MG tablet Take 81 mg by mouth daily.    . calcium acetate (PHOSLO) 667 MG capsule Take 2 tablet by mouth three times a day     . calcium-vitamin D (OSCAL WITH D) 500-200 MG-UNIT per tablet Take 1 tablet by mouth 3 (three) times daily.    . carvedilol (COREG) 6.25 MG tablet Take 6.25 mg by mouth 2 (two) times daily with a meal.    . ferrous sulfate 325 (65 FE) MG tablet Take 325 mg by mouth 2 (two) times daily with a meal.     . furosemide (LASIX) 40 MG tablet Take 1 tablet by mouth 2 (two) times daily.     . hydrALAZINE (APRESOLINE) 25 MG tablet Take 25 mg by mouth 2 (two) times daily.     . isosorbide dinitrate (ISORDIL)  30 MG tablet Take 30 mg by mouth daily.    Marland Kitchen. levothyroxine (SYNTHROID, LEVOTHROID) 75 MCG tablet Take 75 mcg by mouth daily before breakfast.    . NON FORMULARY OYSTER SHELL 1 TAB PO TID    . potassium chloride SA (K-DUR,KLOR-CON) 20 MEQ tablet Take 20 mEq by mouth once.    . simvastatin (ZOCOR) 10 MG tablet Take 1 tablet by mouth daily.     No current facility-administered medications for this visit.     Past Surgical History  Procedure Laterality Date  . Pacemaker insertion  11/01/2010    Medtronic Anna. Ladona Ridgelaylor d/t SSS  . Abdominal hysterectomy       Allergies  Allergen Reactions  . Penicillins Rash      Family History  Problem Relation Age of Onset  . Cancer Other   . Coronary artery disease Other       Social History [name] reports that she has never smoked. She has never used smokeless tobacco. [name] reports that she does not drink alcohol.   Review of Systems CONSTITUTIONAL: No weight loss, fever, chills, weakness or fatigue.  HEENT: Eyes: No visual loss, blurred vision, double vision or yellow sclerae.No hearing loss, sneezing, congestion, runny nose or sore throat.  SKIN: No rash or itching.  CARDIOVASCULAR: per HPI RESPIRATORY: No shortness of breath, cough or sputum.  GASTROINTESTINAL: No anorexia, nausea, vomiting or diarrhea. No abdominal pain or blood.  GENITOURINARY: No burning on urination, no polyuria NEUROLOGICAL: No headache, dizziness, syncope, paralysis, ataxia, numbness or tingling in the extremities. No change in bowel or bladder control.  MUSCULOSKELETAL: No muscle, back pain, joint pain or stiffness.  LYMPHATICS: No enlarged nodes. No history of splenectomy.  PSYCHIATRIC: No history of depression or anxiety.  ENDOCRINOLOGIC: No reports of sweating, cold or heat intolerance. No polyuria or polydipsia.  Marland Kitchen.   Physical Examination Filed Vitals:   09/05/15 0953  BP: 105/61  Pulse: 65   Filed Vitals:   09/05/15 0953  Height: 5\' 3"  (1.6 m)  Weight: 102 lb (46.267 kg)    Gen: resting comfortably, no acute distress HEENT: no scleral icterus, pupils equal round and reactive, no palptable cervical adenopathy,  CV: RRR, 2/6 systolic murmur RUSB, no jvd Resp: Clear to auscultation bilaterally GI: abdomen is soft, non-tender, non-distended, normal bowel sounds, no hepatosplenomegaly MSK: extremities are warm, no edema.  Skin: warm, no rash Neuro:  no focal deficits Psych: appropriate affect   Diagnostic Studies 01/2014 echo: LVEF 50-55%, grade II diastolic dysfunction, apical hypokinesis, mild to mod AI, mod MR,      Assessment and Plan  1. Afib - no current symptoms, continue current meds - no anticoag due to history of GI bleed  2.  Chronic systolic HF - previously LVEF as low as 30-35%, most recently her LVEF has normalized at 50-55% - no current symptoms - continue current meds.   3. CAD - presumed based on echo WMAs, no cath in the past due to renal dysfunction - no current symptoms. Continue current meds  4. Tachy-brady syndrome - no current symptoms. Normal device check in 05/2015. Continue to follow  5. Hyperlipidemia - continue statin  F/u 6 months      Antoine PocheJonathan F. Alverda Nazzaro, M.D.

## 2015-09-05 NOTE — Patient Instructions (Signed)
Continue all current medications. Your physician wants you to follow up in: 6 months.  You will receive a reminder letter in the mail one-two months in advance.  If you don't receive a letter, please call our office to schedule the follow up appointment   

## 2015-09-06 ENCOUNTER — Ambulatory Visit: Payer: Medicare Other | Admitting: Cardiology

## 2015-11-23 ENCOUNTER — Ambulatory Visit (INDEPENDENT_AMBULATORY_CARE_PROVIDER_SITE_OTHER): Payer: Medicare Other | Admitting: *Deleted

## 2015-11-23 ENCOUNTER — Encounter: Payer: Self-pay | Admitting: Internal Medicine

## 2015-11-23 DIAGNOSIS — I495 Sick sinus syndrome: Secondary | ICD-10-CM

## 2015-11-23 DIAGNOSIS — Z950 Presence of cardiac pacemaker: Secondary | ICD-10-CM | POA: Diagnosis not present

## 2015-11-23 DIAGNOSIS — I480 Paroxysmal atrial fibrillation: Secondary | ICD-10-CM

## 2015-11-23 LAB — CUP PACEART INCLINIC DEVICE CHECK
Battery Remaining Longevity: 74 mo
Brady Statistic AS VS Percent: 65 %
Implantable Lead Implant Date: 20120113
Implantable Lead Location: 753859
Implantable Lead Model: 5076
Lead Channel Impedance Value: 526 Ohm
Lead Channel Pacing Threshold Amplitude: 0.5 V
Lead Channel Pacing Threshold Amplitude: 1.25 V
Lead Channel Pacing Threshold Pulse Width: 0.4 ms
Lead Channel Pacing Threshold Pulse Width: 0.4 ms
Lead Channel Sensing Intrinsic Amplitude: 4 mV
Lead Channel Setting Pacing Amplitude: 2.5 V
MDC IDC LEAD IMPLANT DT: 20120113
MDC IDC LEAD LOCATION: 753860
MDC IDC MSMT BATTERY IMPEDANCE: 731 Ohm
MDC IDC MSMT BATTERY VOLTAGE: 2.77 V
MDC IDC MSMT LEADCHNL RA PACING THRESHOLD AMPLITUDE: 0.5 V
MDC IDC MSMT LEADCHNL RA PACING THRESHOLD PULSEWIDTH: 0.4 ms
MDC IDC MSMT LEADCHNL RV IMPEDANCE VALUE: 413 Ohm
MDC IDC MSMT LEADCHNL RV PACING THRESHOLD AMPLITUDE: 1 V
MDC IDC MSMT LEADCHNL RV PACING THRESHOLD PULSEWIDTH: 0.4 ms
MDC IDC MSMT LEADCHNL RV SENSING INTR AMPL: 22.4 mV
MDC IDC SESS DTM: 20170203145307
MDC IDC SET LEADCHNL RA PACING AMPLITUDE: 2 V
MDC IDC SET LEADCHNL RV PACING PULSEWIDTH: 0.4 ms
MDC IDC SET LEADCHNL RV SENSING SENSITIVITY: 5.6 mV
MDC IDC STAT BRADY AP VP PERCENT: 0 %
MDC IDC STAT BRADY AP VS PERCENT: 35 %
MDC IDC STAT BRADY AS VP PERCENT: 0 %

## 2015-11-23 NOTE — Progress Notes (Signed)
Pacemaker check in clinic. Normal device function. Thresholds, sensing, impedances consistent with previous measurements. Device programmed to maximize longevity. No mode switch episodes. 7 high ventricular rates noted- longest with EGM 9 beats NSVT. Device programmed at appropriate safety margins. Histogram distribution appropriate for patient activity level. Device programmed to optimize intrinsic conduction. Estimated longevity 4.5-7.5 years. ROV with JA/[facility] in August.

## 2016-03-06 ENCOUNTER — Ambulatory Visit (INDEPENDENT_AMBULATORY_CARE_PROVIDER_SITE_OTHER): Payer: Medicare Other | Admitting: Cardiology

## 2016-03-06 ENCOUNTER — Encounter: Payer: Self-pay | Admitting: Cardiology

## 2016-03-06 ENCOUNTER — Encounter: Payer: Self-pay | Admitting: *Deleted

## 2016-03-06 VITALS — BP 93/56 | HR 63 | Ht 63.0 in | Wt 97.2 lb

## 2016-03-06 DIAGNOSIS — I2510 Atherosclerotic heart disease of native coronary artery without angina pectoris: Secondary | ICD-10-CM | POA: Diagnosis not present

## 2016-03-06 DIAGNOSIS — I5022 Chronic systolic (congestive) heart failure: Secondary | ICD-10-CM

## 2016-03-06 DIAGNOSIS — I495 Sick sinus syndrome: Secondary | ICD-10-CM

## 2016-03-06 DIAGNOSIS — I480 Paroxysmal atrial fibrillation: Secondary | ICD-10-CM

## 2016-03-06 NOTE — Progress Notes (Signed)
Patient ID: [name], female   DOB: 01/22/1926, 82 y.o.   MRN: [id number]     Clinical Summary [name] is a 82 y.o.female seen today for follow up of the following medical problems.   1. Afib - amio stopped in the past due to severe DLCO abnormality - according to [name] notes she has history of GI bleeding and has not been on anticoagulation. From EP note no afib noted in over 3 years by [name] interrogation.    - denies any recent palpitations. Compliant with meds - denies any SOB, no lightheadendess  2. Chronic systolic HF - LVEF as low as 30-35%. Most recently echo 01/2014 50-55%.  No LE edema, no orthopnea, no PND. No significant SOB or DOE   3. CAD - suspected prior anterior MI based on echo WMAs, has not had confirmatory cath due to poor renal function.  - denies any recent chest pain  4. Tachy-brady syndrome - has pacemaker. Last device check 11/2015 normal function - denies any lightheadness, no dizziness.   5. Hyperlipdiemia - compliant with statin     SH: her daughter recently passed away Past Medical History  Diagnosis Date  . Paroxysmal atrial fibrillation (HCC)   . History of amiodarone therapy     Stopped December 24, 2010, severe DLCO abnormality  . Warfarin anticoagulation     Held. February, 2012, 3 unit bleed  . CHF (congestive heart failure) (HCC)     EF 30-35%, echo, December, 2011, presumed ischemic  . Cardiomyopathy     Presumed ischemic  . CAD (coronary artery disease)     Hospitalization December, 2011, suspected prior anterior MI, no heart catheter done (elevated creatinine), echo, December, 2011, apical aneurysm  . Aortic insufficiency     Mild, echo, December, 2011  . Hypertension   . Tachycardia-bradycardia [name](HCC)     Pacemaker January, 2012  . Pacemaker     January, 2012, Medtronic, tachybradycardia syndrome or he  . Chronic kidney disease (CKD), stage IV (severe) (HCC)   . Anemia     Transfused 3 units, February, 2012, ??  component spontaneous right flank bleed, Coumadin held, colonoscopy done later with no significant abnormalities  . Pleural effusion, left     Pleurx catheter February, 2012, eventually removed  . Abnormal PFTs     February, 2012, with moderate restriction, moderate obstruction, severely decreased DLCO  . Hypothyroidism     Diagnosed March, 2012, Synthroid started, may be related to amiodarone  . Carotid artery disease (HCC)   . Ejection fraction      Allergies  Allergen Reactions  . Penicillins Rash     Current Outpatient Prescriptions  Medication Sig Dispense Refill  . aspirin 81 MG tablet Take 81 mg by mouth daily. Reported on 11/23/2015    . calcium acetate (PHOSLO) 667 MG capsule Take 2 tablet by mouth three times a day     . calcium-vitamin D (OSCAL WITH D) 500-200 MG-UNIT per tablet Take 1 tablet by mouth 3 (three) times daily.    . carvedilol (COREG) 6.25 MG tablet Take 6.25 mg by mouth 2 (two) times daily with a meal.    . ferrous sulfate 325 (65 FE) MG tablet Take 325 mg by mouth 2 (two) times daily with a meal.     . furosemide (LASIX) 40 MG tablet Take 1 tablet by mouth 2 (two) times daily.     . hydrALAZINE (APRESOLINE) 25 MG tablet Take 25 mg by mouth 2 (two) times  daily. Reported on 11/23/2015    . isosorbide dinitrate (ISORDIL) 30 MG tablet Take 30 mg by mouth daily.    Marland Kitchen levothyroxine (SYNTHROID, LEVOTHROID) 75 MCG tablet Take 75 mcg by mouth daily before breakfast.    . potassium chloride SA (K-DUR,KLOR-CON) 20 MEQ tablet Take 20 mEq by mouth once.    . simvastatin (ZOCOR) 10 MG tablet Take 1 tablet by mouth daily.     No current facility-administered medications for this visit.     Past Surgical History  Procedure Laterality Date  . Pacemaker insertion  11/01/2010    Medtronic [name] d/t SSS  . Abdominal hysterectomy       Allergies  Allergen Reactions  . Penicillins Rash      Family History  Problem Relation Age of Onset  . Cancer Other   .  Coronary artery disease Other      Social History [name] reports that she has never smoked. She has never used smokeless tobacco. [name] reports that she does not drink alcohol.   Review of Systems CONSTITUTIONAL: No weight loss, fever, chills, weakness or fatigue.  HEENT: Eyes: No visual loss, blurred vision, double vision or yellow sclerae.No hearing loss, sneezing, congestion, runny nose or sore throat.  SKIN: No rash or itching.  CARDIOVASCULAR: per HPI RESPIRATORY: No shortness of breath, cough or sputum.  GASTROINTESTINAL: No anorexia, nausea, vomiting or diarrhea. No abdominal pain or blood.  GENITOURINARY: No burning on urination, no polyuria NEUROLOGICAL: No headache, dizziness, syncope, paralysis, ataxia, numbness or tingling in the extremities. No change in bowel or bladder control.  MUSCULOSKELETAL: No muscle, back pain, joint pain or stiffness.  LYMPHATICS: No enlarged nodes. No history of splenectomy.  PSYCHIATRIC: No history of depression or anxiety.  ENDOCRINOLOGIC: No reports of sweating, cold or heat intolerance. No polyuria or polydipsia.  Marland Kitchen   Physical Examination Filed Vitals:   03/06/16 1418  BP: 93/56  Pulse: 63   Filed Vitals:   03/06/16 1418  Height:  (1.6 m)  Weight: 97 lb 3.2 oz (44.09 kg)    Gen: resting comfortably, no acute distress HEENT: no scleral icterus, pupils equal round and reactive, no palptable cervical adenopathy,  CV: RRR, 2/6 sysotlic murmur at apex, no jvd Resp: Clear to auscultation bilaterally GI: abdomen is soft, non-tender, non-distended, normal bowel sounds, no hepatosplenomegaly MSK: extremities are warm, no edema.  Skin: warm, no rash Neuro:  no focal deficits Psych: appropriate affect   Diagnostic Studies 01/2014 echo: LVEF 50-55%, grade II diastolic dysfunction, apical hypokinesis, mild to mod AI, mod MR,     Assessment and Plan   1. Afib - no current symptoms. She has not had a noted  recurrence in several years.  - no anticoag due to history of GI bleed  2. Chronic systolic HF - previously LVEF as low as 30-35%, most recently her LVEF has normalized at 50-55% - no recent symptoms. We will continue current meds.   3. CAD - presumed based on echo WMAs, no cath in the past due to renal dysfunction - no current symptoms. We will continue current meds  4. Tachy-brady syndrome - normal device check at last visit, conitnue to monitor  5. Hyperlipidemia - continue statin, request labs form pcp  F/u 6 months     Antoine Poche, M.D.

## 2016-03-06 NOTE — Patient Instructions (Signed)

## 2016-05-23 ENCOUNTER — Encounter: Payer: Medicare Other | Admitting: Internal Medicine

## 2016-07-25 ENCOUNTER — Ambulatory Visit (INDEPENDENT_AMBULATORY_CARE_PROVIDER_SITE_OTHER): Payer: Medicare Other | Admitting: Internal Medicine

## 2016-07-25 ENCOUNTER — Encounter: Payer: Self-pay | Admitting: Internal Medicine

## 2016-07-25 VITALS — BP 82/44 | HR 71 | Ht 63.0 in | Wt 97.0 lb

## 2016-07-25 DIAGNOSIS — I480 Paroxysmal atrial fibrillation: Secondary | ICD-10-CM

## 2016-07-25 DIAGNOSIS — Z950 Presence of cardiac pacemaker: Secondary | ICD-10-CM | POA: Diagnosis not present

## 2016-07-25 DIAGNOSIS — I495 Sick sinus syndrome: Secondary | ICD-10-CM | POA: Diagnosis not present

## 2016-07-25 DIAGNOSIS — I2510 Atherosclerotic heart disease of native coronary artery without angina pectoris: Secondary | ICD-10-CM

## 2016-07-25 LAB — CUP PACEART INCLINIC DEVICE CHECK
Brady Statistic AP VP Percent: 0 %
Brady Statistic AP VS Percent: 39 %
Brady Statistic AS VP Percent: 0 %
Brady Statistic AS VS Percent: 61 %
Date Time Interrogation Session: 20171006125652
Implantable Lead Implant Date: 20120113
Implantable Lead Location: 753859
Lead Channel Impedance Value: 534 Ohm
Lead Channel Pacing Threshold Amplitude: 0.375 V
Lead Channel Pacing Threshold Amplitude: 1 V
Lead Channel Pacing Threshold Amplitude: 1.125 V
Lead Channel Pacing Threshold Pulse Width: 0.4 ms
Lead Channel Sensing Intrinsic Amplitude: 22.4 mV
Lead Channel Setting Pacing Amplitude: 2 V
Lead Channel Setting Pacing Amplitude: 2.5 V
Lead Channel Setting Pacing Pulse Width: 0.4 ms
MDC IDC LEAD IMPLANT DT: 20120113
MDC IDC LEAD LOCATION: 753860
MDC IDC MSMT BATTERY IMPEDANCE: 1049 Ohm
MDC IDC MSMT BATTERY REMAINING LONGEVITY: 60 mo
MDC IDC MSMT BATTERY VOLTAGE: 2.76 V
MDC IDC MSMT LEADCHNL RA PACING THRESHOLD AMPLITUDE: 0.5 V
MDC IDC MSMT LEADCHNL RA PACING THRESHOLD PULSEWIDTH: 0.4 ms
MDC IDC MSMT LEADCHNL RA PACING THRESHOLD PULSEWIDTH: 0.4 ms
MDC IDC MSMT LEADCHNL RA SENSING INTR AMPL: 4 mV
MDC IDC MSMT LEADCHNL RV IMPEDANCE VALUE: 420 Ohm
MDC IDC MSMT LEADCHNL RV PACING THRESHOLD PULSEWIDTH: 0.4 ms
MDC IDC SET LEADCHNL RV SENSING SENSITIVITY: 5.6 mV

## 2016-07-25 NOTE — Progress Notes (Signed)
PCP: Toma Deiters, MD Primary Cardiologist:  [name] is a 82 y.o. female who presents today for routine electrophysiology followup.  Since last being seen in our clinic, the patient reports doing very well.  She seems to be doing ok but does not get out much.  Today, she denies symptoms of palpitations, chest pain, shortness of breath,  lower extremity edema, dizziness, presyncope, or syncope.  The patient is otherwise without complaint today.   Past Medical History:  Diagnosis Date  . Abnormal PFTs    February, 2012, with moderate restriction, moderate obstruction, severely decreased DLCO  . Anemia    Transfused 3 units, February, 2012, ?? component spontaneous right flank bleed, Coumadin held, colonoscopy done later with no significant abnormalities  . Aortic insufficiency    Mild, echo, December, 2011  . CAD (coronary artery disease)    Hospitalization December, 2011, suspected prior anterior MI, no heart catheter done (elevated creatinine), echo, December, 2011, apical aneurysm  . Cardiomyopathy    Presumed ischemic  . Carotid artery disease (HCC)   . CHF (congestive heart failure) (HCC)    EF 30-35%, echo, December, 2011, presumed ischemic  . Chronic kidney disease (CKD), stage IV (severe) (HCC)   . Ejection fraction   . History of amiodarone therapy    Stopped December 24, 2010, severe DLCO abnormality  . Hypertension   . Hypothyroidism    Diagnosed March, 2012, Synthroid started, may be related to amiodarone  . Pacemaker    January, 2012, Medtronic, tachybradycardia syndrome or he  . Paroxysmal atrial fibrillation (HCC)   . Pleural effusion, left    Pleurx catheter February, 2012, eventually removed  . Tachycardia-bradycardia [facility])    Pacemaker January, 2012  . Warfarin anticoagulation    Held. February, 2012, 3 unit bleed   Past Surgical History:  Procedure Laterality Date  . ABDOMINAL HYSTERECTOMY    . PACEMAKER INSERTION  11/01/2010   Medtronic  Dr. Ladona Ridgel d/t SSS    Current Outpatient Prescriptions  Medication Sig Dispense Refill  . aspirin 81 MG tablet Take 81 mg by mouth daily. Reported on 11/23/2015    . calcium acetate (PHOSLO) 667 MG capsule Take 2 tablet by mouth three times a day     . carvedilol (COREG) 6.25 MG tablet Take 6.25 mg by mouth 2 (two) times daily with a meal.    . ferrous sulfate 325 (65 FE) MG tablet Take 325 mg by mouth 2 (two) times daily with a meal.     . furosemide (LASIX) 40 MG tablet Take 1 tablet by mouth 2 (two) times daily.     . isosorbide dinitrate (ISORDIL) 30 MG tablet Take 30 mg by mouth daily.    Marland Kitchen levETIRAcetam (KEPPRA) 250 MG tablet Take 250 mg by mouth 2 (two) times daily.    Marland Kitchen levothyroxine (SYNTHROID, LEVOTHROID) 75 MCG tablet Take 75 mcg by mouth daily before breakfast.    . mirtazapine (REMERON) 15 MG tablet Take 15 mg by mouth at bedtime.    . potassium chloride (K-DUR) 10 MEQ tablet Take 20 mEq by mouth daily.    . simvastatin (ZOCOR) 10 MG tablet Take 1 tablet by mouth daily.     No current facility-administered medications for this visit.     Physical Exam: Vitals:   07/25/16 1117  BP: (!) 68/32  Pulse: 71  SpO2: 96%  Weight: 97 lb (44 kg)  Height: 5\' 3"  (1.6 m)    GEN- The patient  is elderly and frail appearing, alert and oriented x 3 today.   Head- normocephalic, atraumatic Eyes-  Sclera clear, conjunctiva pink Ears- hearing intact Oropharynx- clear Lungs- Clear to ausculation bilaterally, normal work of breathing Chest- pacemaker pocket is well healed Heart- Regular rate and rhythm, no murmurs, rubs or gallops, PMI not laterally displaced GI- soft, NT, ND, + BS Extremities- no clubbing, cyanosis, or edema  Pacemaker interrogation- reviewed in detail today,  See PACEART report  Assessment and Plan:  1. Sinus Bradycardia Normal pacemaker function See Pace Art report No changes today  2. afib Maintaining sinus rhythm off of AAD Previous GI bleeding with  coumadin Not felt to be a candidate for anticoagulation previously No changes today No afib in over 4 years per PPM interrogation  3. HTN Stable No change required today  carelink I will see again in 1 year  Hillis RangeJames Josie Mesa MD, [facility] 07/25/2016 11:51 AM

## 2016-07-25 NOTE — Patient Instructions (Addendum)
Medication Instructions:  Continue all current medications.  Labwork: none  Testing/Procedures: none  Follow-Up: Your physician wants you to follow up in:  1 year.  You will receive a reminder letter in the mail one-two months in advance.  If you don't receive a letter, please call our office to schedule the follow up appointment   Any Other Special Instructions Will Be Listed Below (If Applicable). Remote monitoring is used to monitor your Pacemaker of ICD from home. This monitoring reduces the number of office visits required to check your device to one time per year. It allows us to keep an eye on the functioning of your device to ensure it is working properly. You are scheduled for a device check from home on 10-27-2016. You may send your transmission at any time that day. If you have a wireless device, the transmission will be sent automatically. After your physician reviews your transmission, you will receive a postcard with your next transmission date.    If you need a refill on your cardiac medications before your next appointment, please call your pharmacy.  

## 2016-10-09 ENCOUNTER — Encounter: Payer: Self-pay | Admitting: Vascular Surgery

## 2016-10-20 DEATH — deceased

## 2016-10-22 ENCOUNTER — Encounter: Payer: Medicare Other | Admitting: Vascular Surgery

## 2016-10-28 ENCOUNTER — Ambulatory Visit: Payer: Medicare Other | Admitting: Cardiology
